# Patient Record
Sex: Female | Born: 1975 | Race: Black or African American | Hispanic: No | Marital: Single | Smoking: Never smoker
Health system: Southern US, Community
[De-identification: ages and names within clinical notes are randomized; demographics above are authoritative.]

## PROBLEM LIST (undated history)

## (undated) DIAGNOSIS — G43909 Migraine, unspecified, not intractable, without status migrainosus: Secondary | ICD-10-CM

## (undated) DIAGNOSIS — R569 Unspecified convulsions: Secondary | ICD-10-CM

## (undated) HISTORY — PX: CHOLECYSTECTOMY: SHX55

## (undated) HISTORY — DX: Unspecified convulsions: R56.9

## (undated) HISTORY — PX: TONSILLECTOMY: SUR1361

## (undated) HISTORY — DX: Migraine, unspecified, not intractable, without status migrainosus: G43.909

---

## 2018-11-18 ENCOUNTER — Encounter: Payer: Self-pay | Admitting: *Deleted

## 2018-11-21 ENCOUNTER — Encounter: Payer: Self-pay | Admitting: Neurology

## 2018-11-21 ENCOUNTER — Telehealth: Payer: Self-pay | Admitting: Neurology

## 2018-11-21 ENCOUNTER — Ambulatory Visit: Payer: BC Managed Care – PPO | Admitting: Neurology

## 2018-11-21 ENCOUNTER — Other Ambulatory Visit: Payer: Self-pay

## 2018-11-21 VITALS — BP 127/82 | HR 98 | Temp 98.0°F | Ht 67.0 in | Wt 334.0 lb

## 2018-11-21 DIAGNOSIS — G43111 Migraine with aura, intractable, with status migrainosus: Secondary | ICD-10-CM | POA: Diagnosis not present

## 2018-11-21 DIAGNOSIS — G40209 Localization-related (focal) (partial) symptomatic epilepsy and epileptic syndromes with complex partial seizures, not intractable, without status epilepticus: Secondary | ICD-10-CM

## 2018-11-21 MED ORDER — SUMATRIPTAN SUCCINATE 50 MG PO TABS: 50.0000 mg | ORAL_TABLET | ORAL | 11 refills | Status: DC | PRN

## 2018-11-21 MED ORDER — LEVETIRACETAM ER 750 MG PO TB24: 3000.0000 mg | ORAL_TABLET | Freq: Every day | ORAL | 4 refills | Status: DC

## 2018-11-21 MED ORDER — AIMOVIG 70 MG/ML ~~LOC~~ SOAJ: 70.0000 mg | SUBCUTANEOUS | 11 refills | Status: DC

## 2018-11-21 MED ORDER — LAMOTRIGINE 150 MG PO TABS: 150.0000 mg | ORAL_TABLET | Freq: Two times a day (BID) | ORAL | 4 refills | Status: DC

## 2018-11-21 NOTE — Telephone Encounter (Signed)
due to the cost she is going to wait. I did offer her the payment plan. She stated that she would like to sign a release form to get her other MRI's that she had a [facility] neurologic in [place].     BCBS Auth: [id number] (exp. 11/21/18 to 05/19/19).

## 2018-11-21 NOTE — Progress Notes (Signed)
PATIENT: Ann Travis Mogan DOB: 08/25/1975  Chief Complaint  Patient presents with  . Seizures    She was diagnosed with seizures in 2008.  She is here to establish new neurology care.  She is doing well on her current medications.  Her last seizure was approximately three years ago.    Marland Kitchen. OB-GYN    Arlana LindauFisher, Julie, NP (no PCP)     HISTORICAL  Ann Travis Thelin is a 43 year old female, seen in request by primary care nurse practitioner Arlana LindauFisher, Julie for evaluation of seizure and migraine headaches, initial evaluation was on November 21, 2018,  I have reviewed and summarized the referring note from the referring physician.  She was diagnosed with complex partial seizure since 2008, presented with transient confusion, mumbling, finger fidgety movement, few minutes, [facility], per patient, there was abnormality seen on MRI of the brain, and EEG, over the years she was treated with titrating dose of Keppra, lamotrigine, has been on current stable dose of Keppra XR 750 mg 4 tablets every morning, lamotrigine 150 mg twice a day, which works well for her seizure, last week current seizure was few years ago,  She also reported long history of migraine headaches, usually at the right retro-orbital area, preceded by visual aura, followed by pounding headache with associated light noise sensitivity, lasting few hours to few days, over-the-counter Excedrin Migraine does not provide effective help   REVIEW OF SYSTEMS: Full 14 system review of systems performed and notable only for as above All other review of systems were negative.  ALLERGIES: Allergies  Allergen Reactions  . Morphine And Related Anaphylaxis and Hives    HOME MEDICATIONS: Current Outpatient Medications  Medication Sig Dispense Refill  . CALCIUM PO Take 600 mg by mouth daily.    Marland Kitchen. lamoTRIgine (LAMICTAL) 150 MG tablet Take 150 mg by mouth 2 (two) times daily.     . Levetiracetam (KEPPRA XR) 750  MG TB24 Take 3,000 mg by mouth daily.     . medroxyPROGESTERone (DEPO-PROVERA) 150 MG/ML injection Inject 150 mg into the muscle every 3 (three) months.    . Multiple Vitamin (MULTIVITAMIN) tablet Take 1 tablet by mouth daily.    . nortriptyline (PAMELOR) 25 MG capsule Take 25 mg by mouth at bedtime.     No current facility-administered medications for this visit.     PAST MEDICAL HISTORY: Past Medical History:  Diagnosis Date  . Migraine   . Seizure (HCC)     PAST SURGICAL HISTORY: Past Surgical History:  Procedure Laterality Date  . CHOLECYSTECTOMY    . TONSILLECTOMY      FAMILY HISTORY: Family History  Problem Relation Age of Onset  . Hypertension Mother   . Stroke Mother   . Breast cancer Mother        passed away at age 161  . Hypercholesterolemia Mother   . Diabetes Father   . Hypertension Father   . Hypercholesterolemia Father   . Breast cancer Maternal Aunt        passed away at age 45  . Diabetes Maternal Aunt     SOCIAL HISTORY: Social History   Socioeconomic History  . Marital status: Single    Spouse name: Not on file  . Number of children: 1  . Years of education: college  . Highest education level: Not on file  Occupational History  . 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 Financial resource strain: Not on file  . Food  insecurity    Worry: Not on file    Inability: Not on file  . Transportation needs    Medical: Not on file    Non-medical: Not on file  Tobacco Use  . Smoking status: Never Smoker  . Smokeless tobacco: Never Used  Substance and Sexual Activity  . Alcohol use: Not Currently  . Drug use: Never  . Sexual activity: Not on file  Lifestyle  . Physical activity    Days per week: Not on file    Minutes per session: Not on file  . Stress: Not on file  Relationships  . Social Herbalist on phone: Not on file    Gets together: Not on file    Attends religious service: Not on file    Active member of club or  organization: Not on file    Attends meetings of clubs or organizations: Not on file    Relationship status: Not on file  . Intimate partner violence    Fear of current or ex partner: Not on file    Emotionally abused: Not on file    Physically abused: Not on file    Forced sexual activity: Not on file  Other Topics Concern  . Not on file  Social History Narrative   Lives alone.   No caffeine use.   Right-handed.     PHYSICAL EXAM   Vitals:   11/21/18 0720  BP: 127/82  Pulse: 98  Temp: 98 F (36.7 C)  Weight: (!) 334 lb (151.5 kg)  Height: 5\' 7"  (1.702 m)    Not recorded      Body mass index is 52.31 kg/m.  PHYSICAL EXAMNIATION:  Gen: NAD, conversant, well nourised, obese, well groomed                     Cardiovascular: Regular rate rhythm, no peripheral edema, warm, nontender. Eyes: Conjunctivae clear without exudates or hemorrhage Neck: Supple, no carotid bruits. Pulmonary: Clear to auscultation bilaterally   NEUROLOGICAL EXAM:  MENTAL STATUS: Speech:    Speech is normal; fluent and spontaneous with normal comprehension.  Cognition:     Orientation to time, place and person     Normal recent and remote memory     Normal Attention span and concentration     Normal Language, naming, repeating,spontaneous speech     Fund of knowledge   CRANIAL NERVES: CN II: Visual fields are full to confrontation. Fundoscopic exam is normal with sharp discs and no vascular changes. Pupils are round equal and briskly reactive to light. CN III, IV, VI: extraocular movement are normal. No ptosis. CN V: Facial sensation is intact to pinprick in all 3 divisions bilaterally. Corneal responses are intact.  CN VII: Face is symmetric with normal eye closure and smile. CN VIII: Hearing is normal to rubbing fingers CN IX, X: Palate elevates symmetrically. Phonation is normal. CN XI: Head turning and shoulder shrug are intact CN XII: Tongue is midline with normal movements and no  atrophy.  MOTOR: There is no pronator drift of out-stretched arms. Muscle bulk and tone are normal. Muscle strength is normal.  REFLEXES: Reflexes are 2+ and symmetric at the biceps, triceps, knees, and ankles. Plantar responses are flexor.  SENSORY: Intact to light touch, pinprick, positional sensation and vibratory sensation are intact in fingers and toes.  COORDINATION: Rapid alternating movements and fine finger movements are intact. There is no dysmetria on finger-to-nose and heel-knee-shin.    GAIT/STANCE: Obese,  need push-up to get up from seated position, mildly unsteady Romberg is absent.   DIAGNOSTIC DATA (LABS, IMAGING, TESTING) - I reviewed patient records, labs, notes, testing and imaging myself where available.   ASSESSMENT AND PLAN  Ann Travis Flinchbaugh is a 43 y.o. female   Complex partial seizure Chronic migraine headache with aura  Proceed with laboratory evaluations  MRI of the brain with without contrast  EEG  Refill her prescription of Keppra xr 750mg  4 tabs daily, lamotrigine 150mg  bid  Aimovig 70mg  every month as migraine preventive medications  Imitrex 50mg  prn  Levert FeinsteinYijun Tully Burgo, M.D. Ph.D.  [facility] [address], [address] [place], [place] [address] Ph: [phone] Fax: [phone]  CC: Arlana LindauFisher, Julie, NP

## 2018-11-22 ENCOUNTER — Telehealth: Payer: Self-pay | Admitting: *Deleted

## 2018-11-22 LAB — COMPREHENSIVE METABOLIC PANEL
ALT: 16 IU/L (ref 0–32)
AST: 19 IU/L (ref 0–40)
Albumin/Globulin Ratio: 1.3 (ref 1.2–2.2)
Albumin: 4 g/dL (ref 3.8–4.8)
Alkaline Phosphatase: 96 IU/L (ref 39–117)
BUN/Creatinine Ratio: 14 (ref 9–23)
BUN: 14 mg/dL (ref 6–24)
Bilirubin Total: 0.2 mg/dL (ref 0.0–1.2)
CO2: 24 mmol/L (ref 20–29)
Calcium: 9.2 mg/dL (ref 8.7–10.2)
Chloride: 101 mmol/L (ref 96–106)
Creatinine, Ser: 1.01 mg/dL — ABNORMAL HIGH (ref 0.57–1.00)
GFR calc Af Amer: 78 mL/min/{1.73_m2} (ref >59)
GFR calc non Af Amer: 68 mL/min/{1.73_m2} (ref >59)
Globulin, Total: 3 g/dL (ref 1.5–4.5)
Glucose: 70 mg/dL (ref 65–99)
Potassium: 4 mmol/L (ref 3.5–5.2)
Sodium: 138 mmol/L (ref 134–144)
Total Protein: 7 g/dL (ref 6.0–8.5)

## 2018-11-22 LAB — CBC WITH DIFFERENTIAL
Basophils Absolute: 0 10*3/uL (ref 0.0–0.2)
Basos: 0 %
EOS (ABSOLUTE): 0 10*3/uL (ref 0.0–0.4)
Eos: 0 %
Hematocrit: 40.2 % (ref 34.0–46.6)
Hemoglobin: 13.2 g/dL (ref 11.1–15.9)
Immature Grans (Abs): 0 10*3/uL (ref 0.0–0.1)
Immature Granulocytes: 0 %
Lymphocytes Absolute: 1.5 10*3/uL (ref 0.7–3.1)
Lymphs: 24 %
MCH: 27.2 pg (ref 26.6–33.0)
MCHC: 32.8 g/dL (ref 31.5–35.7)
MCV: 83 fL (ref 79–97)
Monocytes Absolute: 0.4 10*3/uL (ref 0.1–0.9)
Monocytes: 6 %
Neutrophils Absolute: 4.4 10*3/uL (ref 1.4–7.0)
Neutrophils: 70 %
RBC: 4.86 x10E6/uL (ref 3.77–5.28)
RDW: 13 % (ref 11.7–15.4)
WBC: 6.2 10*3/uL (ref 3.4–10.8)

## 2018-11-22 LAB — TSH: TSH: 1.85 u[IU]/mL (ref 0.450–4.500)

## 2018-11-22 NOTE — Telephone Encounter (Signed)
PA for Aimovig completed on covermymeds (key: [id number]).  Pt has coverage through [facility] [phone]).  PA approved through 02/19/2019.

## 2018-11-22 NOTE — Telephone Encounter (Signed)
I spoke to the patient and she has been informed of her lab results. 

## 2018-11-22 NOTE — Telephone Encounter (Signed)
-----   Message from Marcial Pacas, MD sent at 11/22/2018  8:19 AM EDT ----- Please call patient for no significant abnormality on laboratory evaluations

## 2018-11-30 ENCOUNTER — Telehealth: Payer: Self-pay | Admitting: Neurology

## 2018-11-30 NOTE — Telephone Encounter (Signed)
I called this patient to schedule an EEG per Dr. Krista Blue. Patient is concerned about the cost and what insurance will cover. I advised patient that regardless of insurance she is able to set up a monthly payment plan with our billing department. I also advised patient that she should call her insurance to get the specific information. Patient states she will call back if she would like to schedule this after speaking with her insurance company.

## 2018-12-20 ENCOUNTER — Telehealth: Payer: Self-pay | Admitting: Neurology

## 2018-12-20 NOTE — Telephone Encounter (Signed)
A 90 day prescription x 4 refills was [facility] to [facility] on [address] on 11/21/2018.  I attempted to return the call to the patient to notify her that this request has already been completed.  No answer and voicemail full.  She just needs to call the pharmacy when she is ready to pick up her next prescription.

## 2018-12-20 NOTE — Telephone Encounter (Signed)
Pt called needing a refill on her Levetiracetam (KEPPRA XR) 750 MG TB24 [facility] on [address] Pt would like to get a 3 month supply due to it being cheater that way for her. Please advise.

## 2019-01-12 ENCOUNTER — Telehealth: Payer: Self-pay | Admitting: Neurology

## 2019-01-12 MED ORDER — LINACLOTIDE 72 MCG PO CAPS: 72.0000 ug | ORAL_CAPSULE | Freq: Every day | ORAL | 11 refills | Status: DC

## 2019-01-12 NOTE — Telephone Encounter (Signed)
Pt states she is having constipation as a result of Aimovig.  Pt is asking something be called in for her.  Please call

## 2019-01-12 NOTE — Addendum Note (Signed)
Addended by: Marcial Pacas on: 01/12/2019 01:12 PM   Modules accepted: Orders

## 2019-01-12 NOTE — Telephone Encounter (Signed)
I prescribed Linzess 72 mg daily  Ajovy might has less constipation side effect, we may switch her to Ajovy if she wants

## 2019-01-12 NOTE — Telephone Encounter (Signed)
I spoke to the patient.  States she has tried multiple OTC stool softeners.  She even tried the OTC combination of medications used for a colonoscopy.  It helped but only temporarily.  She would like to stay on Aimovig, if possible, because it has been very helpful for her migraines.  She is asking if Linzess can be prescribed.

## 2019-01-12 NOTE — Telephone Encounter (Signed)
I returned the call to the patient to let her know the prescription has been sent to the pharmacy.

## 2019-02-08 ENCOUNTER — Telehealth: Payer: Self-pay | Admitting: *Deleted

## 2019-02-08 NOTE — Telephone Encounter (Signed)
PA for Aimovig completed on covermymeds (key: [id number]).  Pt has coverage through [facility] [phone]).  Decision pending.

## 2019-02-08 NOTE — Telephone Encounter (Signed)
PA approved through 02/07/2020.

## 2019-02-20 ENCOUNTER — Ambulatory Visit: Payer: Self-pay | Admitting: Neurology

## 2019-02-21 ENCOUNTER — Ambulatory Visit: Payer: Self-pay | Admitting: Neurology

## 2019-11-08 ENCOUNTER — Ambulatory Visit: Payer: Self-pay | Admitting: Skilled Nursing Facility1

## 2019-11-09 ENCOUNTER — Other Ambulatory Visit: Payer: Self-pay

## 2019-11-09 ENCOUNTER — Encounter: Payer: 59 | Attending: Surgery | Admitting: Skilled Nursing Facility1

## 2019-11-09 ENCOUNTER — Encounter: Payer: Self-pay | Admitting: Skilled Nursing Facility1

## 2019-11-09 DIAGNOSIS — E669 Obesity, unspecified: Secondary | ICD-10-CM | POA: Insufficient documentation

## 2019-11-09 NOTE — Progress Notes (Signed)
Nutrition Assessment for Bariatric Surgery Medical Nutrition Therapy Appt Start Time: 8:03 End Time: 9:05  Patient was seen on 11/09/2019 for Pre-Operative Nutrition Assessment. Letter of approval faxed to [facility] bariatric surgery program coordinator on 11/09/2019  Referral stated Supervised Weight Loss (SWL) visits needed: 0  Planned surgery: sleeve gastrectomy  Pt expectation of surgery: to lose weight Pt expectation of dietitian: guidance    NUTRITION ASSESSMENT   Anthropometrics  Start weight at [facility]: 344 lbs (date: 11/09/2019)  Height: 67 in BMI: 53.88 kg/m2     Clinical  Medical hx: seizures,  Medications: see list Labs:  Notable signs/symptoms: migraines, allergic to fresh fruits and vegetables, arthritic knees needing surgery, diverticulosis resulting in colectomy  Any previous deficiencies? No  Micronutrient Nutrition Focused Physical Exam: Hair: No issues observed Eyes: No issues observed Mouth: No issues observed Neck: No issues observed Nails: No issues observed Skin: No issues observed  Lifestyle & Dietary Hx  Allergies: banana, tree nuts, grapes, apples, watermelon, all fresh fruits but canned fruit is okay, tomato sauce but not fresh tomato Pt states he does not eat greens because it looks like grass.  Pt states she has been doing water aerobics.   24-Hr Dietary Recall First Meal: sausage dog  Snack: protein bar Second Meal: cheese burger Snack: cereal Third Meal: cereal Snack:  Beverages: water   Estimated Energy Needs Calories: 1600   NUTRITION DIAGNOSIS  Overweight/obesity ([place]-3.3) related to past poor dietary habits and physical inactivity as evidenced by patient w/ planned sleeve gastrectomy surgery following dietary guidelines for continued weight loss.    NUTRITION INTERVENTION  Nutrition counseling (C-1) and education (E-2) to facilitate bariatric surgery goals.   Pre-Op Goals Reviewed with the Patient . Track food  and beverage intake (pen and paper, MyFitness Pal, Baritastic app, etc.) . Make healthy food choices while monitoring portion sizes . Consume 3 meals per day or try to eat every 3-5 hours . Avoid concentrated sugars and fried foods . Keep sugar & fat in the single digits per serving on food labels . Practice CHEWING your food (aim for applesauce consistency) . Practice not drinking 15 minutes before, during, and 30 minutes after each meal and snack . Avoid all carbonated beverages (ex: soda, sparkling beverages)  . Limit caffeinated beverages (ex: coffee, tea, energy drinks) . Avoid all sugar-sweetened beverages (ex: regular soda, sports drinks)  . Avoid alcohol  . Aim for 64-100 ounces of FLUID daily (with at least half of fluid intake being plain water)  . Aim for at least 60-80 grams of PROTEIN daily . Look for a liquid protein source that contains ?15 g protein and ?5 g carbohydrate (ex: shakes, drinks, shots) . Make a list of non-food related activities . Physical activity is an important part of a healthy lifestyle so keep it moving! The goal is to reach 150 minutes of exercise per week, including cardiovascular and weight baring activity.  *Goals that are bolded indicate the pt would like to start working towards these  Handouts Provided Include  . Bariatric Surgery handouts (Nutrition Visits, Pre-Op Goals, Protein Shakes, Vitamins & Minerals)  Learning Style & Readiness for Change Teaching method utilized: Visual & Auditory  Demonstrated degree of understanding via: Teach Back  Barriers to learning/adherence to lifestyle change: picky eating     MONITORING & EVALUATION Dietary intake, weekly physical activity, body weight, and pre-op goals reached at next nutrition visit.    Next Steps  Patient is to follow up at [facility]  for Pre-Op Class >2 weeks before surgery for further nutrition education.

## 2019-12-11 ENCOUNTER — Other Ambulatory Visit: Payer: Self-pay | Admitting: Surgery

## 2019-12-11 ENCOUNTER — Other Ambulatory Visit (HOSPITAL_COMMUNITY): Payer: Self-pay | Admitting: Surgery

## 2019-12-25 ENCOUNTER — Other Ambulatory Visit: Payer: Self-pay

## 2019-12-25 ENCOUNTER — Ambulatory Visit (HOSPITAL_COMMUNITY)
Admission: RE | Admit: 2019-12-25 | Discharge: 2019-12-25 | Disposition: A | Discharge status: Home / Self Care | Payer: 59 | Source: Ambulatory Visit | Attending: Surgery | Admitting: Surgery

## 2020-01-20 ENCOUNTER — Other Ambulatory Visit: Payer: Self-pay | Admitting: Neurology

## 2020-01-21 ENCOUNTER — Other Ambulatory Visit: Payer: Self-pay | Admitting: Neurology

## 2020-02-01 ENCOUNTER — Encounter: Payer: Self-pay | Admitting: Gastroenterology

## 2020-02-15 ENCOUNTER — Other Ambulatory Visit: Payer: Self-pay | Admitting: Neurology

## 2020-02-20 ENCOUNTER — Encounter: Payer: Self-pay | Admitting: Neurology

## 2020-02-20 ENCOUNTER — Ambulatory Visit (INDEPENDENT_AMBULATORY_CARE_PROVIDER_SITE_OTHER): Payer: 59 | Admitting: Neurology

## 2020-02-20 ENCOUNTER — Telehealth: Payer: Self-pay | Admitting: Neurology

## 2020-02-20 ENCOUNTER — Other Ambulatory Visit: Payer: Self-pay

## 2020-02-20 VITALS — BP 143/91 | HR 69 | Ht 67.0 in | Wt 342.0 lb

## 2020-02-20 DIAGNOSIS — G40209 Localization-related (focal) (partial) symptomatic epilepsy and epileptic syndromes with complex partial seizures, not intractable, without status epilepticus: Secondary | ICD-10-CM

## 2020-02-20 DIAGNOSIS — G43111 Migraine with aura, intractable, with status migrainosus: Secondary | ICD-10-CM

## 2020-02-20 MED ORDER — LAMOTRIGINE 100 MG PO TABS: 100.0000 mg | ORAL_TABLET | Freq: Two times a day (BID) | ORAL | 4 refills | Status: DC

## 2020-02-20 MED ORDER — LEVETIRACETAM ER 750 MG PO TB24
3000.0000 mg | ORAL_TABLET | Freq: Every day | ORAL | 4 refills | Status: DC
Start: 2020-02-20 — End: 2021-05-07

## 2020-02-20 MED ORDER — DOCUSATE SODIUM 100 MG PO CAPS: 100.0000 mg | ORAL_CAPSULE | Freq: Two times a day (BID) | ORAL | 11 refills | Status: AC

## 2020-02-20 MED ORDER — SUMATRIPTAN SUCCINATE 50 MG PO TABS: 50.0000 mg | ORAL_TABLET | ORAL | 11 refills | Status: DC | PRN

## 2020-02-20 NOTE — Progress Notes (Addendum)
PATIENT: Ann Travis DOB: 03/20/1976  Chief Complaint  Patient presents with  . Migraine    She stopped using Aimovig 70mg  after three months. She does not like giving herself injections and felt it did not reduce her headaches. She would like to discuss an oral preventive instead. She never tried the sumatriptan but would like it refilled today.   . Seizures    She is taking Keppra XR 750mg , 4 tabs daily. She is only taking Lamictal 150mg  in the morning because she forget her evening dose. Denies any seizure activity.      HISTORICAL  Ann Travis is a 44 year old female, seen in request by primary care nurse practitioner for evaluation of seizure and migraine headaches, initial evaluation was on November 21, 2018,  I have reviewed and summarized the referring note from the referring physician.  She was diagnosed with complex partial seizure since 2008, presented with transient confusion, mumbling, finger fidgety movement, few minutes, she was under the care of [facility] neurologist, per patient, there was abnormality seen on MRI of the brain, and EEG, over the years she was treated with titrating dose of Keppra, lamotrigine, has been on current stable dose of Keppra XR 750 mg 4 tablets every morning, lamotrigine 150 mg twice a day, which works well for her seizure, last week current seizure was few years ago,  She also reported long history of migraine headaches, usually at the right retro-orbital area, preceded by visual aura, followed by pounding headache with associated light noise sensitivity, lasting few hours to few days, over-the-counter Excedrin Migraine does not provide effective help  Update February 20, 2020: Laboratory evaluation showed normal CBC CMP TSH, She did not have previously ordered MRI of the brain and EEG due to concern of the cost, she is taking Keppra XR 750 mg 4 tablets every night, lamotrigine only 150 mg every morning, there was no recurrent  seizure, She has headache once every 2 weeks, she never picked up her Imitrex,  Previously treating neurologist was at 2009, last office visit with them was early 2020 before she moved  REVIEW OF SYSTEMS: Full 14 system review of systems performed and notable only for as above All other review of systems were negative.  ALLERGIES: Allergies  Allergen Reactions  . Morphine And Related Anaphylaxis and Hives    HOME MEDICATIONS: Current Outpatient Medications  Medication Sig Dispense Refill  . CALCIUM PO Take 600 mg by mouth daily.    February 22, 2020 lamoTRIgine (LAMICTAL) 150 MG tablet TAKE 1 TABLET BY MOUTH TWICE A DAY 180 tablet 0  . Levetiracetam 750 MG TB24 TAKE 4 TABLETS (3,000 MG TOTAL) BY MOUTH DAILY. 360 tablet 0  . medroxyPROGESTERone (DEPO-PROVERA) 150 MG/ML injection Inject 150 mg into the muscle every 3 (three) months.    . nortriptyline (PAMELOR) 25 MG capsule Take 25 mg by mouth as needed for sleep.     . SUMAtriptan (IMITREX) 50 MG tablet Take 1 tablet (50 mg total) by mouth every 2 (two) hours as needed for migraine. May repeat in 2 hours if headache persists or recurs. 12 tablet 11   No current facility-administered medications for this visit.    PAST MEDICAL HISTORY: Past Medical History:  Diagnosis Date  . Migraine   . Seizure (HCC)     PAST SURGICAL HISTORY: Past Surgical History:  Procedure Laterality Date  . CHOLECYSTECTOMY    . TONSILLECTOMY      FAMILY HISTORY: Family History  Problem Relation Age of  Onset  . Hypertension Mother   . Stroke Mother   . Breast cancer Mother        passed away at age 68  . Hypercholesterolemia Mother   . Diabetes Father   . Hypertension Father   . Hypercholesterolemia Father   . Breast cancer Maternal Aunt        passed away at age 39  . Diabetes Maternal Aunt     SOCIAL HISTORY: Social History   Socioeconomic History  . Marital status: Single    Spouse name: Not on file  . Number of children: 1  . Years of  education: college  . Highest education level: Not on file  Occupational History  . Occupation: Child psychotherapist  Tobacco Use  . Smoking status: Never Smoker  . Smokeless tobacco: Never Used  Substance and Sexual Activity  . Alcohol use: Not Currently  . Drug use: Never  . Sexual activity: Not on file  Other Topics Concern  . Not on file  Social History Narrative   Lives alone.   No caffeine use.   Right-handed.   Social Determinants of Health   Financial Resource Strain:   . Difficulty of Paying Living Expenses: Not on file  Food Insecurity:   . Worried About Programme researcher, broadcasting/film/video in the Last Year: Not on file  . Ran Out of Food in the Last Year: Not on file  Transportation Needs:   . Lack of Transportation (Medical): Not on file  . Lack of Transportation (Non-Medical): Not on file  Physical Activity:   . Days of Exercise per Week: Not on file  . Minutes of Exercise per Session: Not on file  Stress:   . Feeling of Stress : Not on file  Social Connections:   . Frequency of Communication with Friends and Family: Not on file  . Frequency of Social Gatherings with Friends and Family: Not on file  . Attends Religious Services: Not on file  . Active Member of Clubs or Organizations: Not on file  . Attends Banker Meetings: Not on file  . Marital Status: Not on file  Intimate Partner Violence:   . Fear of Current or Ex-Partner: Not on file  . Emotionally Abused: Not on file  . Physically Abused: Not on file  . Sexually Abused: Not on file     PHYSICAL EXAM   Vitals:   02/20/20 0921  BP: (!) 143/91  Pulse: 69  Weight: (!) 342 lb (155.1 kg)  Height: 5\' 7"  (1.702 m)   Not recorded     Body mass index is 53.56 kg/m.  PHYSICAL EXAMNIATION:  Gen: NAD, conversant, well nourised, obese, well groomed, obese              NEUROLOGICAL EXAM:  MENTAL STATUS: Speech/cognition: Awake alert oriented to history taking and casual conversation   CRANIAL  NERVES: CN II: Visual fields are full to confrontation. Pupils are round equal and briskly reactive to light. CN III, IV, VI: extraocular movement are normal. No ptosis. CN V: Facial sensation is intact to pinprick in all 3 divisions bilaterally.  CN VII: Face is symmetric with normal eye closure and smile. CN VIII: Hearing is normal to rubbing fingers CN IX, X: Palate elevates symmetrically. Phonation is normal. CN XI: Head turning and shoulder shrug are intact   MOTOR: There is no pronator drift of out-stretched arms. Muscle bulk and tone are normal. Muscle strength is normal.  REFLEXES: Reflexes are 2+ and symmetric  at the biceps, triceps, knees, and ankles. Plantar responses are flexor.  SENSORY: Intact to light touch, pinprick, positional sensation and vibratory sensation are intact in fingers and toes.  COORDINATION: Rapid alternating movements and fine finger movements are intact. There is no dysmetria on finger-to-nose and heel-knee-shin.    GAIT/STANCE: Obese, need push-up to get up from seated position, mildly unsteady due to big body habitus Romberg is absent.   DIAGNOSTIC DATA (LABS, IMAGING, TESTING) - I reviewed patient records, labs, notes, testing and imaging myself where available.   ASSESSMENT AND PLAN  Ann Travis is a 44 y.o. female   Reported history of complex partial seizure Chronic migraine headache with aura  Medical record from previous treating physician at [facility],  Refill her prescription of Keppra xr 750mg  4 tabs daily, she had no recurrent seizure taking lower dose of lamotrigine 150 mg daily, will decrease to lamotrigine 100 mg twice a day  Imitrex 50mg  prn  Chronic constipation  Colace as needed  , M.D. Ph.D.  [facility] [address] [place], [address] [place] Ph: [phone] Fax: [phone]  CC: [phone], NP  Addendum: I read [facility] neurology evaluation by Dr.Noroozi, [phone] From March 25, 2018, reported history of seizure, on generic Keppra XR 750 mg 4 tablets daily, generic lamotrigine 150 mg twice a day, normal MRI of the brain and EEG in 2006.  History of migraine headaches  Laboratory evaluations, lamotrigine level on September 03, 2014 was 13.9, normal CBC, hemoglobin of 12.1, CMP, creatinine of 0.91,

## 2020-02-20 NOTE — Telephone Encounter (Signed)
Get medical record from [facility]  Medical Records Staff To request your medical records, download and complete the Authorization for Use or Disclosure of Protected Health Information.  For questions regarding release of information and scanning, please call [phone]  Medical Records Fax Number - [phone]  Mailing Address - Attn: Release of Information, [address] [place], [place] [address]  To send ROI requests by email, please send to [email or web address] for processing

## 2020-03-01 ENCOUNTER — Telehealth: Payer: Self-pay | Admitting: *Deleted

## 2020-03-01 NOTE — Telephone Encounter (Signed)
Dr Meridee Score-  Would you like this patient to have an office visit or direct to [facility]. Last recorded BMI 53.5 and hx seizures.  If you want OV will you please forward this to your medical assistant to set up appointment and schedule at [facility]. Thanks!

## 2020-03-01 NOTE — Telephone Encounter (Signed)
Okay for direct colonoscopy unless patient wants to be seen in clinic first. Thanks. GM

## 2020-03-04 NOTE — Telephone Encounter (Signed)
Attempted to reach patient - unable - LMOVM for patient to call back to the office -will try again to reach patient at a later date/time;

## 2020-03-04 NOTE — Telephone Encounter (Signed)
Records placed on Dr. Zannie Cove desk for review.

## 2020-03-06 NOTE — Telephone Encounter (Signed)
Called and spoke with patient- patient reports she is in the process of moving and is requesting to be scheduled for an OV sometime in the new year due to this process; patient reports she was told she may have a bleeding ulcer due to her NSAID consumption and she is refraining from using these mediations; patient has been scheduled for an  OV and patient advised to call back to the office should further issues arise; patient advised of office number of [phone];

## 2020-03-26 ENCOUNTER — Encounter: Payer: 59 | Admitting: Gastroenterology

## 2020-04-16 ENCOUNTER — Other Ambulatory Visit: Payer: Self-pay | Admitting: Neurology

## 2020-04-17 ENCOUNTER — Ambulatory Visit: Payer: 59 | Admitting: Gastroenterology

## 2021-02-19 ENCOUNTER — Ambulatory Visit: Payer: 59 | Admitting: Neurology

## 2021-03-14 ENCOUNTER — Other Ambulatory Visit: Payer: Self-pay | Admitting: Neurology

## 2021-05-02 ENCOUNTER — Other Ambulatory Visit: Payer: Self-pay | Admitting: Neurology

## 2021-05-06 NOTE — Progress Notes (Signed)
PATIENT: Ann Travis DOB: [birth year]  REASON FOR VISIT: Follow up for seizures, headaches HISTORY FROM: Patient PRIMARY NEUROLOGIST: Dr. Terrace Arabia   HISTORY  Ann Travis is a 46 year old female, seen in request by primary care nurse practitioner Arlana Lindau for evaluation of seizure and migraine headaches, initial evaluation was on November 21, 2018,   I have reviewed and summarized the referring note from the referring physician.  She was diagnosed with complex partial seizure since 2008, presented with transient confusion, mumbling, finger fidgety movement, few minutes, she was under the care of [facility] neurologist, per patient, there was abnormality seen on MRI of the brain, and EEG, over the years she was treated with titrating dose of Keppra, lamotrigine, has been on current stable dose of Keppra XR 750 mg 4 tablets every morning, lamotrigine 150 mg twice a day, which works well for her seizure, last week current seizure was few years ago,   She also reported long history of migraine headaches, usually at the right retro-orbital area, preceded by visual aura, followed by pounding headache with associated light noise sensitivity, lasting few hours to few days, over-the-counter Excedrin Migraine does not provide effective help   Update February 20, 2020: Laboratory evaluation showed normal CBC CMP TSH, She did not have previously ordered MRI of the brain and EEG due to concern of the cost, she is taking Keppra XR 750 mg 4 tablets every night, lamotrigine only 150 mg every morning, there was no recurrent seizure, She has headache once every 2 weeks, she never picked up her Imitrex,   Previously treating neurologist was at [facility], last office visit with them was early 2020 before she moved  Addendum: I read [facility] neurology evaluation by Dr.Noroozi, Jilda Panda From March 25, 2018, reported history of seizure, on generic Keppra XR 750 mg 4 tablets daily, generic lamotrigine 150 mg twice a day,  normal MRI of the brain and EEG in 2006.  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 On Keppra 750 mg XR, 4 tablets in the morning. No seizures, are characterized as lapse in time or mumbling, none in years. Migraines are twice a month that are severe, Imitrex 50 mg is out of the medication, had to combine with Excedrin migraine, benefit wasn't great. Right sided headache. Had sleep study, no sleep apnea.  REVIEW OF SYSTEMS: Out of a complete 14 system review of symptoms, the patient complains only of the following symptoms, and all other reviewed systems are negative.  See HPI  ALLERGIES: Allergies  Allergen Reactions   Morphine And Related Anaphylaxis and Hives    HOME MEDICATIONS: Outpatient Medications Prior to Visit  Medication Sig Dispense Refill   aspirin-acetaminophen-caffeine (EXCEDRIN MIGRAINE) 250-250-65 MG tablet Take by mouth every 6 (six) hours as needed for headache.     CALCIUM PO Take 600 mg by mouth daily.     docusate sodium (COLACE) 100 MG capsule Take 1 capsule (100 mg total) by mouth 2 (two) times daily. 60 capsule 11   medroxyPROGESTERone (DEPO-PROVERA) 150 MG/ML injection Inject 150 mg into the muscle every 3 (three) months.     lamoTRIgine (LAMICTAL) 100 MG tablet TAKE 1 TABLET BY MOUTH TWICE A DAY 180 tablet  0   Levetiracetam 750 MG TB24 Take 4 tablets (3,000 mg total) by mouth daily. 360 tablet 4   SUMAtriptan (IMITREX) 50 MG tablet Take 1 tablet (50 mg total) by mouth every 2 (two) hours as needed for migraine. May repeat in 2 hours if headache persists or recurs. 12 tablet 11   No facility-administered medications prior to visit.    PAST MEDICAL HISTORY: Past Medical  History:  Diagnosis Date   Migraine    Seizure (HCC)     PAST SURGICAL HISTORY: Past Surgical History:  Procedure Laterality Date   CHOLECYSTECTOMY     TONSILLECTOMY      FAMILY HISTORY: Family History  Problem Relation Age of Onset   Hypertension Mother    Stroke Mother    Breast cancer Mother        passed away at age 27   Hypercholesterolemia Mother    Diabetes Father    Hypertension Father    Hypercholesterolemia Father    Breast cancer Maternal Aunt        passed away at age 69   Diabetes Maternal Aunt     SOCIAL HISTORY: Social History   Socioeconomic History   Marital status: Single    Spouse name: Not on file   Number of children: 1   Years of education: college   Highest education level: Not on file  Occupational History   Occupation: Child psychotherapist  Tobacco Use   Smoking status: Never   Smokeless tobacco: Never  Substance and Sexual Activity   Alcohol use: Not Currently   Drug use: Never   Sexual activity: Not on file  Other Topics Concern   Not on file  Social History Narrative   Lives alone.   No caffeine use.   Right-handed.   Social Determinants of Health   Financial Resource Strain: Not on file  Food Insecurity: Not on file  Transportation Needs: Not on file  Physical Activity: Not on file  Stress: Not on file  Social Connections: Not on file  Intimate Partner Violence: Not on file   PHYSICAL EXAM  There were no vitals filed for this visit. There is no height or weight on file to calculate BMI.  Generalized: Well developed, in no acute distress   Neurological examination  Mentation: Alert oriented to time, place, history taking. Follows all commands speech and language fluent Cranial nerve II-XII: Pupils were equal round reactive to light. Extraocular movements were full, visual field were full on confrontational test. Facial sensation and strength were normal. Head turning and shoulder shrug  were normal and symmetric. Motor:  The motor testing reveals 5 over 5 strength of all 4 extremities. Good symmetric motor tone is noted throughout.  Sensory: Sensory testing is intact to soft touch on all 4 extremities. No evidence of extinction is noted.  Coordination: Cerebellar testing reveals good finger-nose-finger and heel-to-shin bilaterally.  Gait and station: Gait is normal.  Reflexes: Deep tendon reflexes are symmetric and normal bilaterally.   DIAGNOSTIC DATA (LABS, IMAGING, TESTING) - I reviewed patient records, labs, notes, testing and imaging myself where available.  Lab Results  Component Value Date   WBC 6.2 11/21/2018   HGB 13.2 11/21/2018   HCT 40.2 11/21/2018   MCV 83 11/21/2018      Component Value Date/Time   NA 138 11/21/2018 0809   K 4.0 11/21/2018 0809   CL 101 11/21/2018 0809   CO2 24 11/21/2018 0809   GLUCOSE 70 11/21/2018 0809   BUN 14  11/21/2018 0809   CREATININE 1.01 (H) 11/21/2018 0809   CALCIUM 9.2 11/21/2018 0809   PROT 7.0 11/21/2018 0809   ALBUMIN 4.0 11/21/2018 0809   AST 19 11/21/2018 0809   ALT 16 11/21/2018 0809   ALKPHOS 96 11/21/2018 0809   BILITOT 0.2 11/21/2018 0809   GFRNONAA 68 11/21/2018 0809   GFRAA 78 11/21/2018 0809   No results found for: CHOL, HDL, LDLCALC, LDLDIRECT, TRIG, CHOLHDL No results found for: ZCHY8F No results found for: VITAMINB12 Lab Results  Component Value Date   TSH 1.850 11/21/2018   ASSESSMENT AND PLAN 46 y.o. year old female   1.  History of complex partial seizure  2.  Chronic migraine headache without aura  -No recent seizure in several years -Supposed to be taking Lamictal 100 mg twice daily, but is only taking 100 mg AM, will switch to Lamictal XR 100 mg daily (in the past seizures were not well controlled on single agent Keppra until Lamictal was added, has been able to reduce the dose from 150 mg twice daily) -Continue Keppra XR 750 mg, 4 tablets daily -Try Maxalt 10 mg as needed for acute headache, Imitrex was not  helpful -Check routine labs today, seizure drug levels  -Call for seizure spells, otherwise follow-up 1 year or sooner if needed  Margie Ege, Edrick Oh, DNP 05/07/2021, 2:26 PM [facility] [address], [address] [place], [place] [address] [phone]

## 2021-05-07 ENCOUNTER — Encounter: Payer: Self-pay | Admitting: Neurology

## 2021-05-07 ENCOUNTER — Ambulatory Visit (INDEPENDENT_AMBULATORY_CARE_PROVIDER_SITE_OTHER): Payer: Managed Care, Other (non HMO) | Admitting: Neurology

## 2021-05-07 DIAGNOSIS — G40209 Localization-related (focal) (partial) symptomatic epilepsy and epileptic syndromes with complex partial seizures, not intractable, without status epilepticus: Secondary | ICD-10-CM | POA: Diagnosis not present

## 2021-05-07 MED ORDER — LEVETIRACETAM ER 750 MG PO TB24
3000.0000 mg | ORAL_TABLET | Freq: Every day | ORAL | 4 refills | Status: DC
Start: 2021-05-07 — End: 2021-12-02

## 2021-05-07 MED ORDER — LAMOTRIGINE ER 100 MG PO TB24: 100.0000 mg | ORAL_TABLET | Freq: Every day | ORAL | 11 refills | Status: DC

## 2021-05-07 MED ORDER — RIZATRIPTAN BENZOATE 10 MG PO TBDP: 10.0000 mg | ORAL_TABLET | ORAL | 11 refills | Status: DC | PRN

## 2021-05-07 NOTE — Patient Instructions (Signed)
Try maxalt for acute headache Switch to Lamictal XR 100 mg daily, can you stop the Lamictal 100 mg AM Continue Keppra  Check labs  Call for any seizures See you back 1 year

## 2021-05-09 LAB — CBC WITH DIFFERENTIAL/PLATELET
Basophils Absolute: 0 10*3/uL (ref 0.0–0.2)
Basos: 0 %
EOS (ABSOLUTE): 0 10*3/uL (ref 0.0–0.4)
Eos: 0 %
Hematocrit: 41.2 % (ref 34.0–46.6)
Hemoglobin: 13 g/dL (ref 11.1–15.9)
Immature Grans (Abs): 0 10*3/uL (ref 0.0–0.1)
Immature Granulocytes: 0 %
Lymphocytes Absolute: 1.3 10*3/uL (ref 0.7–3.1)
Lymphs: 18 %
MCH: 26.7 pg (ref 26.6–33.0)
MCHC: 31.6 g/dL (ref 31.5–35.7)
MCV: 85 fL (ref 79–97)
Monocytes Absolute: 0.5 10*3/uL (ref 0.1–0.9)
Monocytes: 6 %
Neutrophils Absolute: 5.5 10*3/uL (ref 1.4–7.0)
Neutrophils: 76 %
Platelets: 370 10*3/uL (ref 150–450)
RBC: 4.86 x10E6/uL (ref 3.77–5.28)
RDW: 13.3 % (ref 11.7–15.4)
WBC: 7.3 10*3/uL (ref 3.4–10.8)

## 2021-05-09 LAB — COMPREHENSIVE METABOLIC PANEL
ALT: 11 IU/L (ref 0–32)
AST: 14 IU/L (ref 0–40)
Albumin/Globulin Ratio: 1.5 (ref 1.2–2.2)
Albumin: 4.4 g/dL (ref 3.8–4.8)
Alkaline Phosphatase: 102 IU/L (ref 44–121)
BUN/Creatinine Ratio: 12 (ref 9–23)
BUN: 10 mg/dL (ref 6–24)
Bilirubin Total: 0.2 mg/dL (ref 0.0–1.2)
CO2: 22 mmol/L (ref 20–29)
Calcium: 9 mg/dL (ref 8.7–10.2)
Chloride: 103 mmol/L (ref 96–106)
Creatinine, Ser: 0.84 mg/dL (ref 0.57–1.00)
Globulin, Total: 2.9 g/dL (ref 1.5–4.5)
Glucose: 91 mg/dL (ref 70–99)
Potassium: 3.8 mmol/L (ref 3.5–5.2)
Sodium: 140 mmol/L (ref 134–144)
Total Protein: 7.3 g/dL (ref 6.0–8.5)
eGFR: 86 mL/min/{1.73_m2} (ref >59)

## 2021-05-09 LAB — LAMOTRIGINE LEVEL: Lamotrigine Lvl: 4.4 ug/mL (ref 2.0–20.0)

## 2021-05-09 LAB — LEVETIRACETAM LEVEL: Levetiracetam Lvl: 53.8 ug/mL — ABNORMAL HIGH (ref 10.0–40.0)

## 2021-05-12 ENCOUNTER — Telehealth: Payer: Self-pay | Admitting: *Deleted

## 2021-05-12 NOTE — Telephone Encounter (Signed)
Spoke with patient and informed of lab results. Patient verbalized understanding and expressed appreciation for the call. All questions answered.

## 2021-05-12 NOTE — Telephone Encounter (Signed)
-----   Message from Suzzanne Cloud, NP sent at 05/12/2021  2:21 PM EST ----- Please call the patient, labs show random Keppra level is mildly elevated at 53.8, this is ok since no side effect plus not a trough level, CBC and CMP are normal, Lamictal level is therapeutic at 4.4. Continue with medication plan to switch to Lamictal XR, continue Keppra XR. Let me know if she has any questions!! Thanks!!

## 2021-06-13 ENCOUNTER — Other Ambulatory Visit: Payer: Self-pay | Admitting: Neurology

## 2021-11-28 ENCOUNTER — Telehealth: Payer: Self-pay | Admitting: Neurology

## 2021-11-28 NOTE — Telephone Encounter (Signed)
Pt having headaches for week and half. Taking rizatriptan (MAXALT-MLT) 10 MG disintegrating tablet but when taken increases blood pressure. Scheduled appt 12/02/21 at 8:45 am

## 2021-12-02 ENCOUNTER — Ambulatory Visit (INDEPENDENT_AMBULATORY_CARE_PROVIDER_SITE_OTHER): Payer: Commercial Managed Care - HMO | Admitting: Neurology

## 2021-12-02 ENCOUNTER — Other Ambulatory Visit: Payer: Self-pay | Admitting: Neurology

## 2021-12-02 ENCOUNTER — Encounter: Payer: Self-pay | Admitting: Neurology

## 2021-12-02 VITALS — BP 136/87 | HR 73 | Ht 67.0 in | Wt 318.0 lb

## 2021-12-02 DIAGNOSIS — G43111 Migraine with aura, intractable, with status migrainosus: Secondary | ICD-10-CM | POA: Diagnosis not present

## 2021-12-02 DIAGNOSIS — G40209 Localization-related (focal) (partial) symptomatic epilepsy and epileptic syndromes with complex partial seizures, not intractable, without status epilepticus: Secondary | ICD-10-CM | POA: Diagnosis not present

## 2021-12-02 MED ORDER — LEVETIRACETAM ER 750 MG PO TB24: 3000.0000 mg | ORAL_TABLET | Freq: Every day | ORAL | 4 refills | Status: DC

## 2021-12-02 MED ORDER — LAMOTRIGINE ER 100 MG PO TB24: 100.0000 mg | ORAL_TABLET | Freq: Every day | ORAL | 11 refills | Status: DC

## 2021-12-02 MED ORDER — NURTEC 75 MG PO TBDP: 75.0000 mg | ORAL_TABLET | ORAL | 11 refills | Status: DC | PRN

## 2021-12-02 NOTE — Progress Notes (Signed)
PATIENT: Ann Travis DOB: Nov 03, 1975  REASON FOR VISIT: Follow up for seizures, headaches HISTORY FROM: Patient PRIMARY NEUROLOGIST: Dr. Terrace Travis   HISTORY  Ann Travis is a 46 year old female, seen in request by primary care nurse practitioner Ann Travis for evaluation of seizure and migraine headaches, initial evaluation was on November 21, 2018,   I have reviewed and summarized the referring note from the referring physician.  She was diagnosed with complex partial seizure since 2008, presented with transient confusion, mumbling, finger fidgety movement, few minutes, she was under the care of [facility] neurologist, per patient, there was abnormality seen on MRI of the brain, and EEG, over the years she was treated with titrating dose of Keppra, lamotrigine, has been on current stable dose of Keppra XR 750 mg 4 tablets every morning, lamotrigine 150 mg twice a day, which works well for her seizure, last week current seizure was few years ago,   She also reported long history of migraine headaches, usually at the right retro-orbital area, preceded by visual aura, followed by pounding headache with associated light noise sensitivity, lasting few hours to few days, over-the-counter Excedrin Migraine does not provide effective help   Update February 20, 2020: Laboratory evaluation showed normal CBC CMP TSH, She did not have previously ordered MRI of the brain and EEG due to concern of the cost, she is taking Keppra XR 750 mg 4 tablets every night, lamotrigine only 150 mg every morning, there was no recurrent seizure, She has headache once every 2 weeks, she never picked up her Imitrex,   Previously treating neurologist was at [facility], last office visit with them was early 2020 before she moved  Addendum: I read [facility] neurology evaluation by Dr.Noroozi, Ann Travis From March 25, 2018, reported history of seizure, on generic Keppra XR 750 mg 4 tablets daily, generic lamotrigine 150 mg twice a day,  normal MRI of the brain and EEG in 2006.  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 On Keppra 750 mg XR, 4 tablets in the morning. No seizures, are characterized as lapse in time or mumbling, none in years. Migraines are twice a month that are severe, Imitrex 50 mg is out of the medication, had to combine with Excedrin migraine, benefit wasn't great. Right sided headache. Had sleep study, no sleep apnea.  Update December 02, 2021 SS: Labs at last visit 05/07/21 showed Lamictal level 4.4, Keppra level 53.8, CBC and CMP were normal. Wants to discuss new rescue medications, took Maxalt, felt increased BP, had sinus issues at the time, was having to take frequently, felt the Maxalt caused her sinuses to drain. Went to GYN last week BP was in 170's, had taken Maxalt earlier that day. Migraines are right sided. Feeling better today after taking Sudafed. Was also taking Excedrin Migraine.   REVIEW OF SYSTEMS: Out of a complete 14 system review of symptoms, the patient complains only of the following symptoms, and all other reviewed systems are negative.  See HPI  ALLERGIES: Allergies  Allergen Reactions   Morphine And Related Anaphylaxis and Hives    HOME MEDICATIONS: Outpatient Medications Prior to Visit  Medication Sig Dispense Refill   aspirin-acetaminophen-caffeine (EXCEDRIN MIGRAINE) 250-250-65 MG tablet  Take by mouth every 6 (six) hours as needed for headache.     CALCIUM PO Take 600 mg by mouth daily.     docusate sodium (COLACE) 100 MG capsule Take 1 capsule (100 mg total) by mouth 2 (two) times daily. 60 capsule 11   medroxyPROGESTERone (DEPO-PROVERA) 150 MG/ML injection Inject 150 mg into the muscle  every 3 (three) months.     rizatriptan (MAXALT-MLT) 10 MG disintegrating tablet Take 1 tablet (10 mg total) by mouth as needed for migraine. May repeat in 2 hours if needed 10 tablet 11   lamoTRIgine (LAMICTAL XR) 100 MG 24 hour tablet Take 1 tablet (100 mg total) by mouth daily. 30 tablet 11   Levetiracetam 750 MG TB24 Take 4 tablets (3,000 mg total) by mouth daily. 360 tablet 4   No facility-administered medications prior to visit.    PAST MEDICAL HISTORY: Past Medical History:  Diagnosis Date   Migraine    Seizure (HCC)     PAST SURGICAL HISTORY: Past Surgical History:  Procedure Laterality Date   CHOLECYSTECTOMY     TONSILLECTOMY      FAMILY HISTORY: Family History  Problem Relation Age of Onset   Hypertension Mother    Stroke Mother    Breast cancer Mother        passed away at age 73   Hypercholesterolemia Mother    Diabetes Father    Hypertension Father    Hypercholesterolemia Father    Breast cancer Maternal Aunt        passed away at age 26   Diabetes Maternal Aunt     SOCIAL HISTORY: Social History   Socioeconomic History   Marital status: Single    Spouse name: Not on file   Number of children: 1   Years of education: college   Highest education level: Not on file  Occupational History   Occupation: Child psychotherapist  Tobacco Use   Smoking status: Never   Smokeless tobacco: Never  Substance and Sexual Activity   Alcohol use: Not Currently   Drug use: Never   Sexual activity: Not on file  Other Topics Concern   Not on file  Social History Narrative   Lives alone.   No caffeine use.   Right-handed.   Social Determinants of Health   Financial Resource Strain: Not on file  Food Insecurity: Not on file  Transportation Needs: Not on file  Physical Activity: Not on file  Stress: Not on file  Social Connections: Not on file  Intimate Partner Violence: Not on file   PHYSICAL EXAM  Vitals:   12/02/21 0838  BP: 136/87  Pulse: 73  Weight: (!)  318 lb (144.2 kg)  Height: 5\' 7"  (1.702 m)   Body mass index is 49.81 kg/m.  Generalized: Well developed, in no acute distress  Neurological examination  Mentation: Alert oriented to time, place, history taking. Follows all commands speech and language fluent Cranial nerve II-XII: Pupils were equal round reactive to light. Extraocular movements were full, visual field were full on confrontational test. Facial sensation and strength were normal. Head turning and shoulder shrug  were normal and symmetric. Motor: The motor testing reveals 5 over 5 strength of all 4 extremities. Good symmetric motor tone is noted throughout.  Sensory: Sensory testing is intact to soft touch on all 4 extremities. No evidence of extinction is noted.  Coordination: Cerebellar testing reveals good finger-nose-finger and heel-to-shin bilaterally.  Gait and station: Gait is normal.  Reflexes: Deep tendon reflexes  are symmetric and normal bilaterally.   DIAGNOSTIC DATA (LABS, IMAGING, TESTING) - I reviewed patient records, labs, notes, testing and imaging myself where available.  Lab Results  Component Value Date   WBC 7.3 05/07/2021   HGB 13.0 05/07/2021   HCT 41.2 05/07/2021   MCV 85 05/07/2021   PLT 370 05/07/2021      Component Value Date/Time   NA 140 05/07/2021 1347   K 3.8 05/07/2021 1347   CL 103 05/07/2021 1347   CO2 22 05/07/2021 1347   GLUCOSE 91 05/07/2021 1347   BUN 10 05/07/2021 1347   CREATININE 0.84 05/07/2021 1347   CALCIUM 9.0 05/07/2021 1347   PROT 7.3 05/07/2021 1347   ALBUMIN 4.4 05/07/2021 1347   AST 14 05/07/2021 1347   ALT 11 05/07/2021 1347   ALKPHOS 102 05/07/2021 1347   BILITOT 0.2 05/07/2021 1347   GFRNONAA 68 11/21/2018 0809   GFRAA 78 11/21/2018 0809   No results found for: "CHOL", "HDL", "LDLCALC", "LDLDIRECT", "TRIG", "CHOLHDL" No results found for: "HGBA1C" No results found for: "VITAMINB12" Lab Results  Component Value Date   TSH 1.850 11/21/2018    ASSESSMENT AND PLAN 45 y.o. year old female   1.  History of complex partial seizure  -No recent seizures -Continue Keppra, Lamictal at current doses  2.  Chronic migraine headache without aura -Try Nurtec 75 mg as needed for acute headache, given # 2 boxes of samples today; can use co-pay card  -Recent cluster headache likely triggered by sinus issues, rebound headache with medication overuse; reviewed options for virtual visit for sinus issues -Tried and failed: Imitrex, Maxalt, Excedrin Migraine -Encouraged to reach out via MyChart if needed -Follow-up with me in 6 months or sooner if needed  Meds ordered this encounter  Medications   Rimegepant Sulfate (NURTEC) 75 MG TBDP    Sig: Take 75 mg by mouth as needed (take 1 tablet at onset, max is 1 tablet in 24 hours).    Dispense:  8 tablet    Refill:  11   levETIRAcetam (KEPPRA XR) 750 MG 24 hr tablet    Sig: Take 4 tablets (3,000 mg total) by mouth daily.    Dispense:  360 tablet    Refill:  4   lamoTRIgine (LAMICTAL XR) 100 MG 24 hour tablet    Sig: Take 1 tablet (100 mg total) by mouth daily.    Dispense:  30 tablet    Refill:  [address] [place], [place] 12/02/2021, 9:07 AM [facility] [address], [address] [place], [place] [address] [phone]

## 2021-12-02 NOTE — Patient Instructions (Signed)
Try Nurtec for acute headache, max is 1 tablet in 24 hours, use co pay card Consider doing virtual visit for sinus issues  Careful with headache overmedication, potentially rebound headache going on See you back in 6 months

## 2021-12-02 NOTE — Telephone Encounter (Signed)
Noted; will discuss at visit today.

## 2021-12-04 MED ORDER — NURTEC 75 MG PO TBDP: ORAL_TABLET | ORAL | 11 refills | Status: DC

## 2021-12-04 NOTE — Addendum Note (Signed)
Addended by: Ann Maki on: 12/04/2021 04:44 PM   Modules accepted: Orders

## 2022-01-03 IMAGING — DX DG UGI W SINGLE CM
10 of 11 series · 14 of 17 positions shown · non-contrast
Comparison: No pertinent prior exams are available for comparison.

CLINICAL DATA: Morbid obesity. Additional history provided: Patient
reports preoperative examination for bariatric surgeon.

EXAM:
UPPER GI SERIES WITH KUB
TECHNIQUE: After obtaining a scout radiograph a routine upper GI series was
performed using thin and high density barium.
FLUOROSCOPY TIME:  Fluoroscopy Time:  2 minutes, 24 seconds.
Radiation Exposure Index (if provided by the fluoroscopic device):
67.3 mGy
Number of Acquired Spot Images: 4

[t abdomen supine]
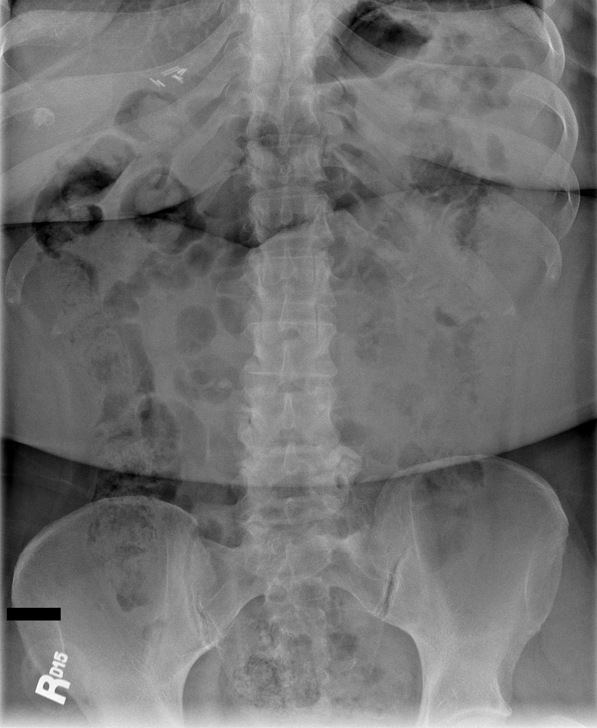

[Series 2: cp_standard · 0.51mm/px · 3 of 129 frames shown (1 of 5)]
[frame 9/129]
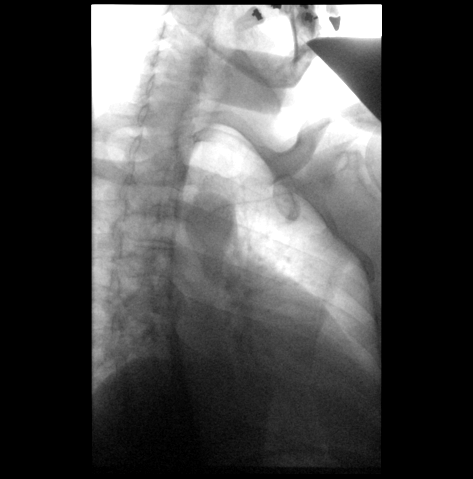
[frame 65/129]
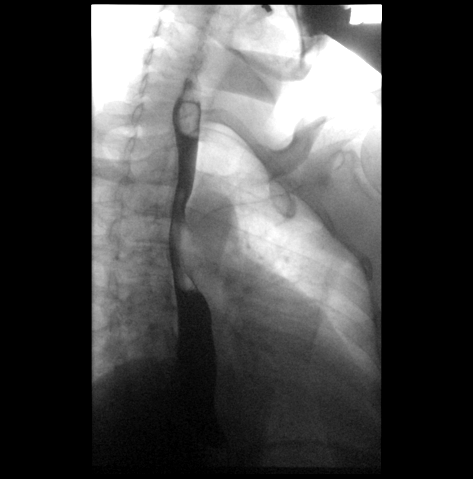
[frame 110/129]
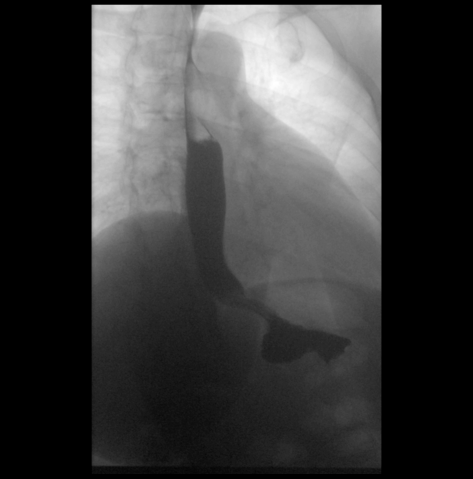

[cp_standard (2 of 5)]
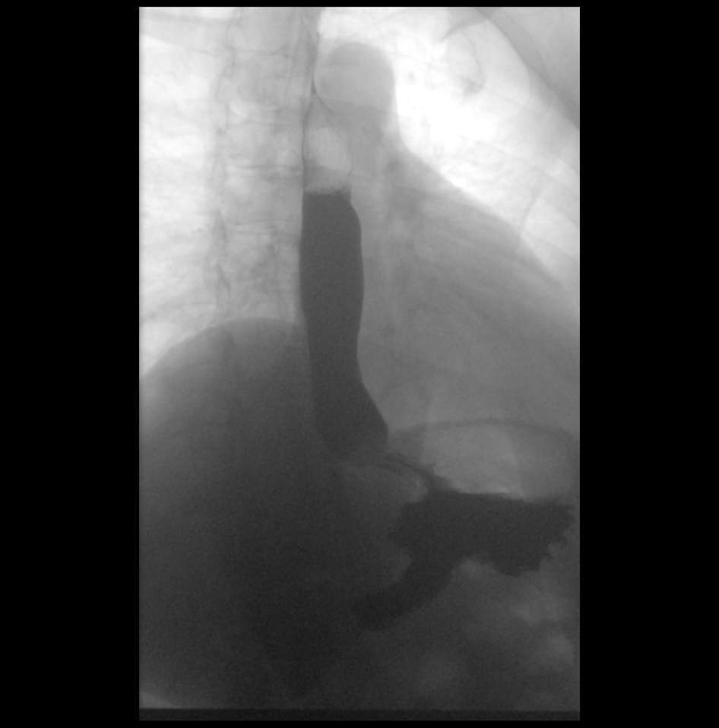

[fluoro_barium singleshot_bw (1 of 4)]
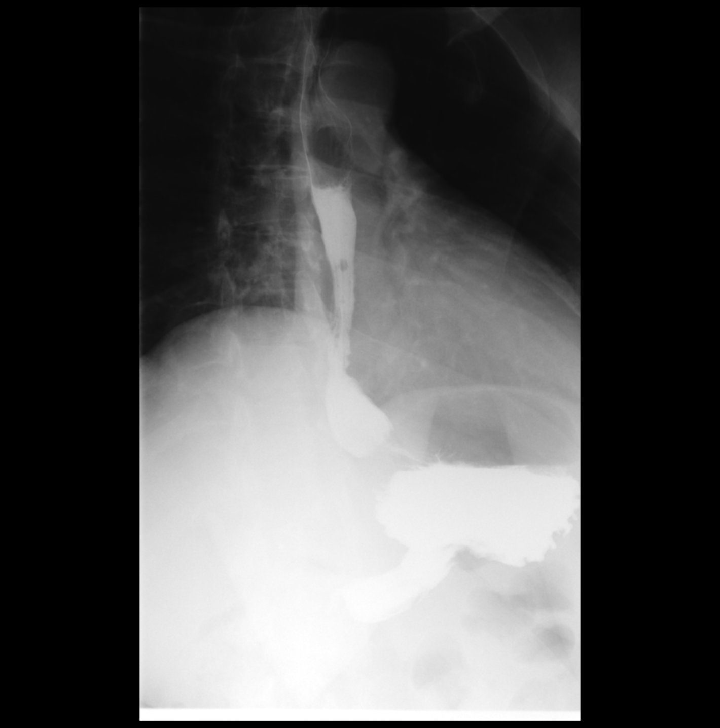

[Series 5: cp_standard · 0.51mm/px · 3 of 72 frames shown (3 of 5)]
[frame 2/72]
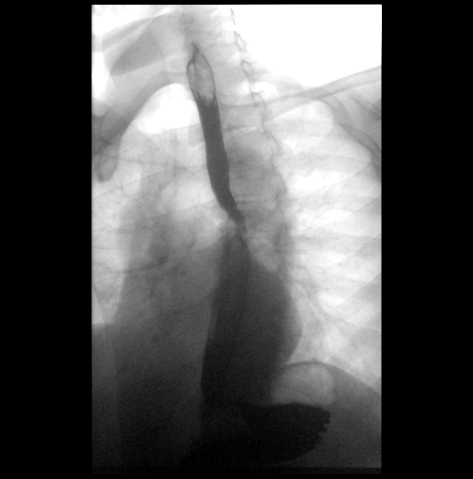
[frame 37/72]
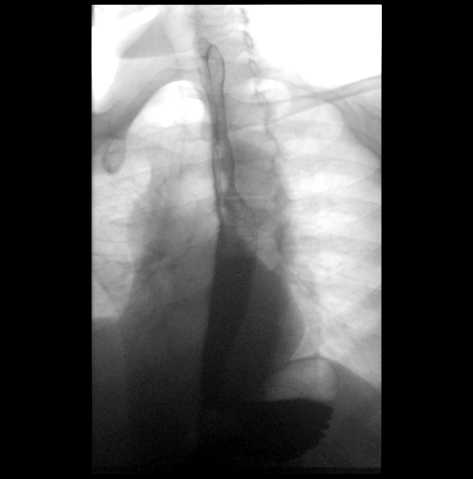
[frame 62/72]
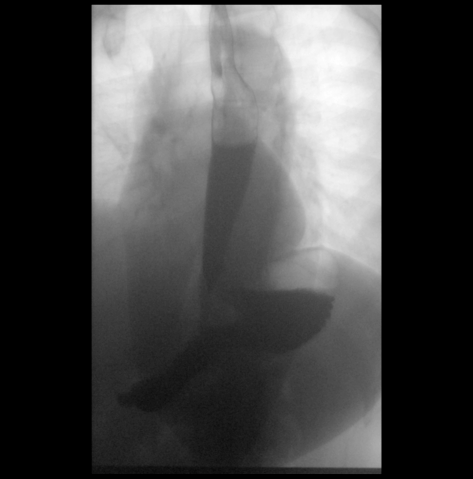

[fluoro_barium singleshot_bw (2 of 4)]
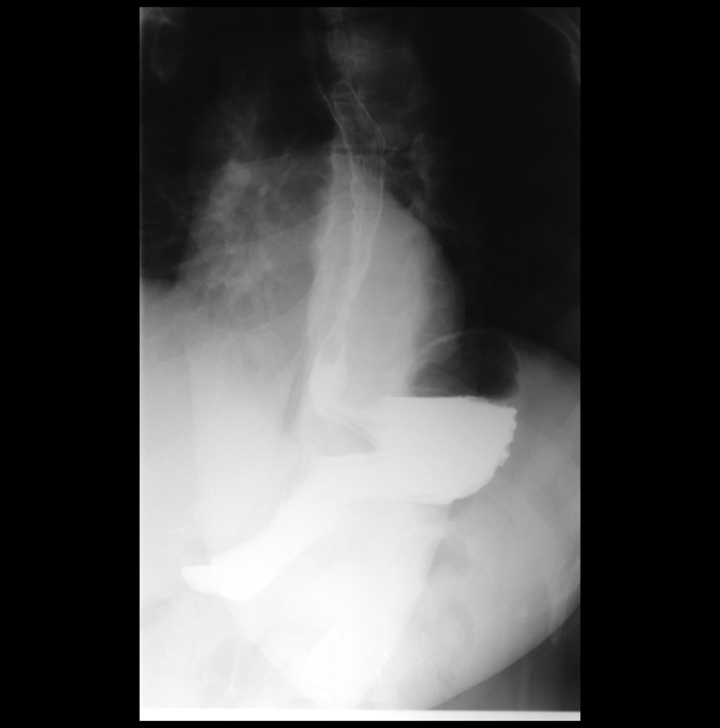

[fluoro_barium singleshot_bw (3 of 4)]
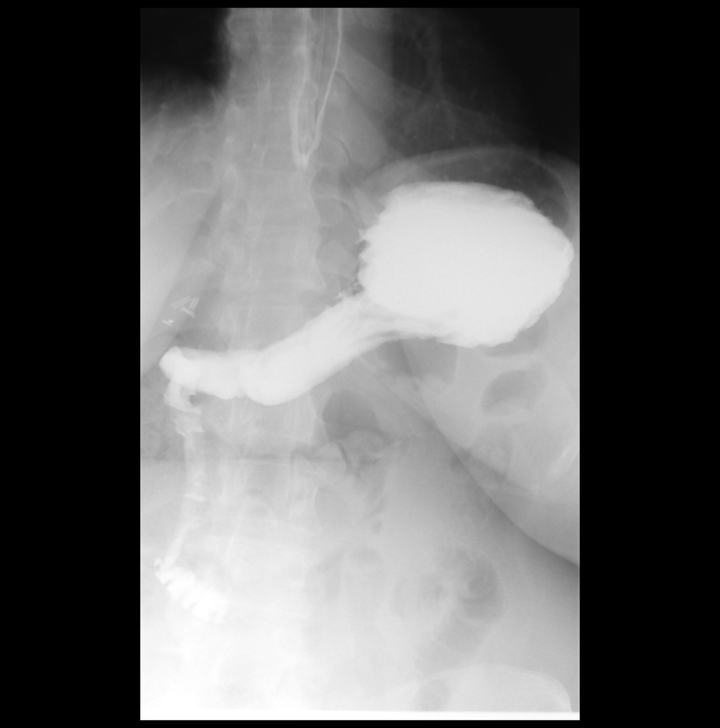

[fluoro_barium singleshot_bw (4 of 4)]
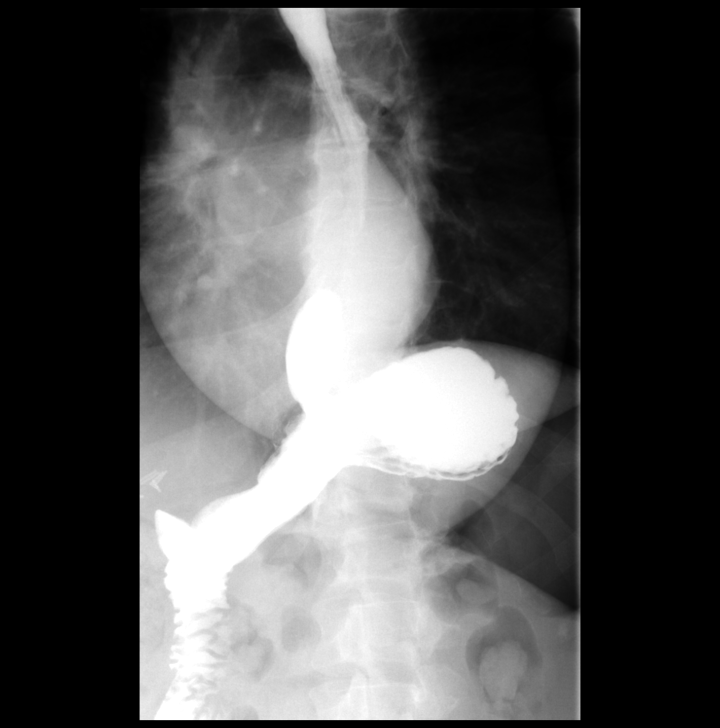

[cp_standard (4 of 5)]
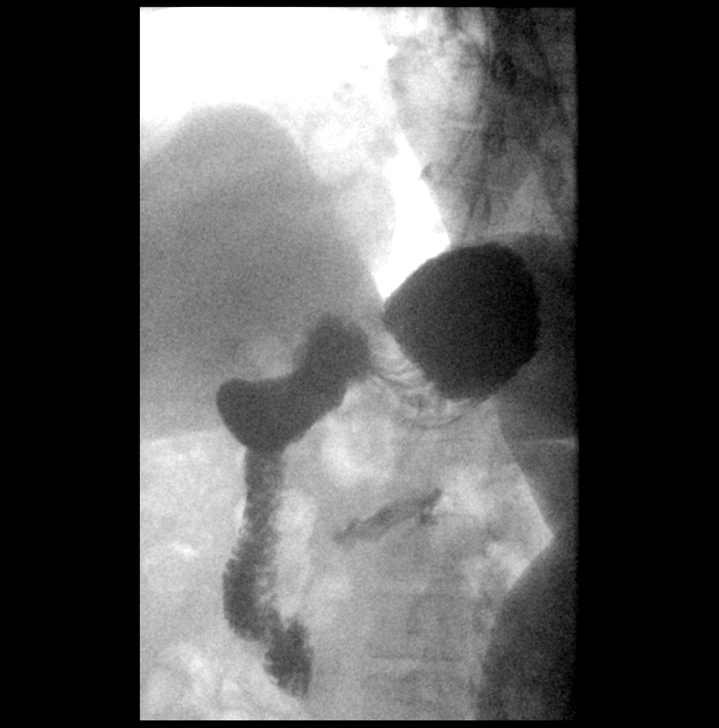

[cp_standard (5 of 5)]
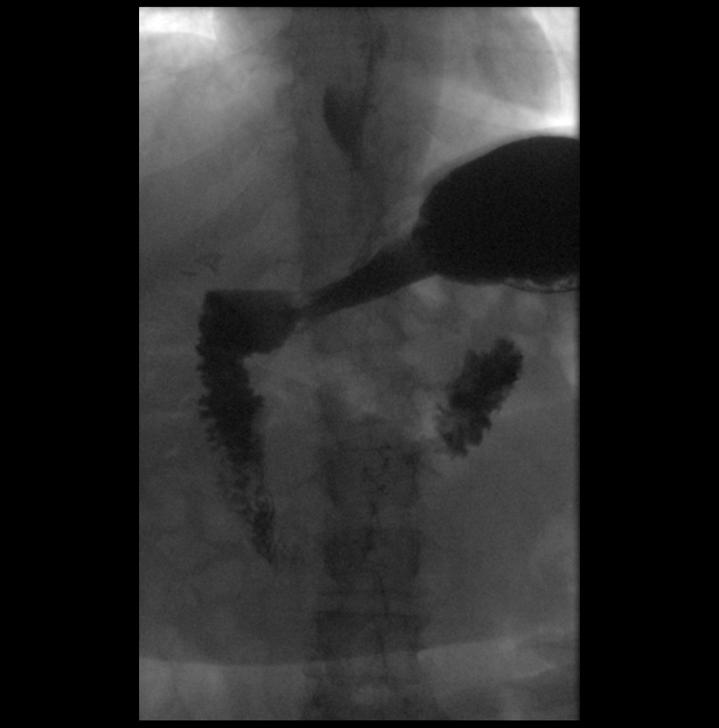

[14 of 17 positions shown; findings below may reference images not displayed]

FINDINGS: A scout radiograph of the abdomen demonstrates cholecystectomy clips
within the right upper quadrant. Nonobstructive bowel gas pattern.
Degenerative changes of the lower lumbar spine

Fluoroscopic evaluation demonstrates normal caliber and smooth
contour of the esophagus. No evidence of fixed stricture, mass or
mucosal abnormality. Normal esophageal motility was observed. No
hiatal hernia. No reflux was observed. The patient swallowed a 13 mm
barium tablet, which freely passed into the stomach.

Normal appearance of the stomach, duodenal bulb and duodenal sweep.
IMPRESSION: Unremarkable single-contrast preoperative upper GI series, as
described.

## 2022-04-28 DIAGNOSIS — Z3042 Encounter for surveillance of injectable contraceptive: Secondary | ICD-10-CM | POA: Diagnosis not present

## 2022-05-07 ENCOUNTER — Ambulatory Visit: Payer: Managed Care, Other (non HMO) | Admitting: Neurology

## 2022-06-08 DIAGNOSIS — H2512 Age-related nuclear cataract, left eye: Secondary | ICD-10-CM | POA: Diagnosis not present

## 2022-06-08 DIAGNOSIS — H4423 Degenerative myopia, bilateral: Secondary | ICD-10-CM | POA: Diagnosis not present

## 2022-06-08 DIAGNOSIS — H2511 Age-related nuclear cataract, right eye: Secondary | ICD-10-CM | POA: Diagnosis not present

## 2022-06-09 DIAGNOSIS — Z13220 Encounter for screening for lipoid disorders: Secondary | ICD-10-CM | POA: Diagnosis not present

## 2022-06-09 DIAGNOSIS — Z131 Encounter for screening for diabetes mellitus: Secondary | ICD-10-CM | POA: Diagnosis not present

## 2022-06-09 DIAGNOSIS — Z6841 Body Mass Index (BMI) 40.0 and over, adult: Secondary | ICD-10-CM | POA: Diagnosis not present

## 2022-07-14 DIAGNOSIS — Z3042 Encounter for surveillance of injectable contraceptive: Secondary | ICD-10-CM | POA: Diagnosis not present

## 2022-10-07 DIAGNOSIS — Z3042 Encounter for surveillance of injectable contraceptive: Secondary | ICD-10-CM | POA: Diagnosis not present

## 2022-11-03 DIAGNOSIS — M170 Bilateral primary osteoarthritis of knee: Secondary | ICD-10-CM | POA: Diagnosis not present

## 2022-11-03 DIAGNOSIS — G8929 Other chronic pain: Secondary | ICD-10-CM | POA: Diagnosis not present

## 2022-11-03 DIAGNOSIS — M25561 Pain in right knee: Secondary | ICD-10-CM | POA: Diagnosis not present

## 2022-11-03 DIAGNOSIS — M25562 Pain in left knee: Secondary | ICD-10-CM | POA: Diagnosis not present

## 2022-11-09 DIAGNOSIS — R87612 Low grade squamous intraepithelial lesion on cytologic smear of cervix (LGSIL): Secondary | ICD-10-CM | POA: Diagnosis not present

## 2022-11-09 DIAGNOSIS — Z1329 Encounter for screening for other suspected endocrine disorder: Secondary | ICD-10-CM | POA: Diagnosis not present

## 2022-11-09 DIAGNOSIS — Z114 Encounter for screening for human immunodeficiency virus [HIV]: Secondary | ICD-10-CM | POA: Diagnosis not present

## 2022-11-09 DIAGNOSIS — Z118 Encounter for screening for other infectious and parasitic diseases: Secondary | ICD-10-CM | POA: Diagnosis not present

## 2022-11-09 DIAGNOSIS — Z0000 Encounter for general adult medical examination without abnormal findings: Secondary | ICD-10-CM | POA: Diagnosis not present

## 2022-11-09 DIAGNOSIS — Z113 Encounter for screening for infections with a predominantly sexual mode of transmission: Secondary | ICD-10-CM | POA: Diagnosis not present

## 2022-11-09 DIAGNOSIS — Z13220 Encounter for screening for lipoid disorders: Secondary | ICD-10-CM | POA: Diagnosis not present

## 2022-11-09 DIAGNOSIS — Z131 Encounter for screening for diabetes mellitus: Secondary | ICD-10-CM | POA: Diagnosis not present

## 2022-11-09 DIAGNOSIS — Z1231 Encounter for screening mammogram for malignant neoplasm of breast: Secondary | ICD-10-CM | POA: Diagnosis not present

## 2022-11-09 DIAGNOSIS — Z01419 Encounter for gynecological examination (general) (routine) without abnormal findings: Secondary | ICD-10-CM | POA: Diagnosis not present

## 2022-11-09 DIAGNOSIS — R87810 Cervical high risk human papillomavirus (HPV) DNA test positive: Secondary | ICD-10-CM | POA: Diagnosis not present

## 2022-11-09 DIAGNOSIS — Z1159 Encounter for screening for other viral diseases: Secondary | ICD-10-CM | POA: Diagnosis not present

## 2022-11-18 ENCOUNTER — Other Ambulatory Visit: Payer: Self-pay | Admitting: Neurology

## 2022-11-18 ENCOUNTER — Ambulatory Visit: Payer: 59 | Admitting: Neurology

## 2022-11-18 ENCOUNTER — Encounter: Payer: Self-pay | Admitting: Neurology

## 2022-11-18 VITALS — BP 147/95 | HR 78 | Ht 67.0 in | Wt 318.6 lb

## 2022-11-18 DIAGNOSIS — G43111 Migraine with aura, intractable, with status migrainosus: Secondary | ICD-10-CM | POA: Diagnosis not present

## 2022-11-18 DIAGNOSIS — G40209 Localization-related (focal) (partial) symptomatic epilepsy and epileptic syndromes with complex partial seizures, not intractable, without status epilepticus: Secondary | ICD-10-CM

## 2022-11-18 MED ORDER — LEVETIRACETAM ER 750 MG PO TB24: 3000.0000 mg | ORAL_TABLET | Freq: Every day | ORAL | 4 refills | Status: DC

## 2022-11-18 MED ORDER — NURTEC 75 MG PO TBDP
75.0000 mg | ORAL_TABLET | ORAL | 11 refills | Status: AC | PRN
Start: 2022-11-18 — End: ?

## 2022-11-18 MED ORDER — LAMOTRIGINE ER 100 MG PO TB24: 100.0000 mg | ORAL_TABLET | Freq: Every day | ORAL | 4 refills | Status: DC

## 2022-11-18 NOTE — Patient Instructions (Signed)
Great to see you today.  Continue current doses of Keppra and Lamictal.  Call for any seizures.  I ordered Nurtec as needed for migraine treatment.  I will see back in 1 year.  Thanks!!

## 2022-11-18 NOTE — Progress Notes (Signed)
PATIENT: Shaw Alice DOB: 1975/12/23  REASON FOR VISIT: Follow up for seizures, headaches HISTORY FROM: Patient PRIMARY NEUROLOGIST: Dr. Terrace Arabia   HISTORY  Latonyia Muldowney is a 47 year old female, seen in request by primary care nurse practitioner Arlana Lindau for evaluation of seizure and migraine headaches, initial evaluation was on November 21, 2018,   I have reviewed and summarized the referring note from the referring physician.  She was diagnosed with complex partial seizure since 2008, presented with transient confusion, mumbling, finger fidgety movement, few minutes, she was under the care of [facility] neurologist, per patient, there was abnormality seen on MRI of the brain, and EEG, over the years she was treated with titrating dose of Keppra, lamotrigine, has been on current stable dose of Keppra XR 750 mg 4 tablets every morning, lamotrigine 150 mg twice a day, which works well for her seizure, last week current seizure was few years ago,   She also reported long history of migraine headaches, usually at the right retro-orbital area, preceded by visual aura, followed by pounding headache with associated light noise sensitivity, lasting few hours to few days, over-the-counter Excedrin Migraine does not provide effective help   Update February 20, 2020: Laboratory evaluation showed normal CBC CMP TSH, She did not have previously ordered MRI of the brain and EEG due to concern of the cost, she is taking Keppra XR 750 mg 4 tablets every night, lamotrigine only 150 mg every morning, there was no recurrent seizure, She has headache once every 2 weeks, she never picked up her Imitrex,   Previously treating neurologist was at [facility], last office visit with them was early 2020 before she moved  Addendum: I read [facility] neurology evaluation by Dr.Noroozi, Jilda Panda From March 25, 2018, reported history of seizure, on generic Keppra XR 750 mg 4 tablets daily, generic lamotrigine 150 mg twice a day,  normal MRI of the brain and EEG in 2006.  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 On Keppra 750 mg XR, 4 tablets in the morning. No seizures, are characterized as lapse in time or mumbling, none in years. Migraines are twice a month that are severe, Imitrex 50 mg is out of the medication, had to combine with Excedrin migraine, benefit wasn't great. Right sided headache. Had sleep study, no sleep apnea.  Update December 02, 2021 SS: Labs at last visit 05/07/21 showed Lamictal level 4.4, Keppra level 53.8, CBC and CMP were normal. Wants to discuss new rescue medications, took Maxalt, felt increased BP, had sinus issues at the time, was having to take frequently, felt the Maxalt caused her sinuses to drain. Went to GYN last week BP was in 170's, had taken Maxalt earlier that day. Migraines are right sided. Feeling better today after taking Sudafed. Was also taking Excedrin Migraine.   Update November 18, 2022 SS: Few more migraines with weather change, within last year 8 migraines. Last month lasted 3 days. Maxalt works, but sometimes have to repeat. Nurtec wasn't covered. Tried sample of Nurtec, it worked great, try again. No seizures to report, remains on Keppra XR 750 mg,  4 tablets daily, Lamictal XR 100 mg daily. Had cataract surgery both eyes in April. Going to  see GI for possible ulcer? Last seizure was probably 8-9 years ago. Describes as staring, humming, rocking, fidgeting hands, lapse in time.    REVIEW OF SYSTEMS: Out of a complete 14 system review of symptoms, the patient complains only of the following symptoms, and all other reviewed systems are negative.  See HPI  ALLERGIES: Allergies  Allergen  Reactions   Morphine And Codeine Anaphylaxis and Hives   Fruit Extracts     Apples, oranges and other fruits ( throat swells and  Oral itching)   Apples, oranges and other fruits ( throat swells and  Oral itching)   Vegetable Extract     Lettuce, cabbage, carrots ( oral edema and itching)   Lettuce, cabbage, carrots ( oral edema and itching)    HOME MEDICATIONS: Outpatient Medications Prior to Visit  Medication Sig Dispense Refill   acetaminophen (TYLENOL) 650 MG CR tablet Take 650 mg by mouth every 8 (eight) hours as needed for pain.     aspirin-acetaminophen-caffeine (EXCEDRIN MIGRAINE) 250-250-65 MG tablet Take by mouth every 6 (six) hours as needed for headache.     CALCIUM PO Take 600 mg by mouth daily.     Coenzyme Q10 100 MG capsule Take 200 mg by mouth daily.     docusate sodium (COLACE) 100 MG capsule Take 1 capsule (100 mg total) by mouth 2 (two) times daily. 60 capsule 11   folic acid (FOLVITE) 1 MG tablet Take 1 mg by mouth daily.     medroxyPROGESTERone (DEPO-PROVERA) 150 MG/ML injection Inject 150 mg into the muscle every 3 (three) months.     naproxen sodium (ALEVE) 220 MG tablet Take 220 mg by mouth daily as needed.     Rimegepant Sulfate (NURTEC) 75 MG TBDP TAKE 1 TABLET BY MOUTH AS NEEDED AT ONSET OF HEADACHE *MAX 1 TAB IN 24 HOURS* 8 tablet 11   rizatriptan (MAXALT-MLT) 10 MG disintegrating tablet Take 1 tablet (10 mg total) by mouth as needed for migraine. May repeat in 2 hours if needed 10 tablet 11   lamoTRIgine (LAMICTAL XR) 100 MG 24 hour tablet Take 1 tablet (100 mg total) by mouth daily. 30 tablet 11   levETIRAcetam (KEPPRA XR) 750 MG 24 hr tablet Take 4 tablets (3,000 mg total) by mouth daily. 360 tablet 4   No facility-administered medications prior to visit.    PAST MEDICAL HISTORY: Past Medical History:  Diagnosis Date   Migraine    Seizure (HCC)     PAST SURGICAL HISTORY: Past Surgical History:  Procedure Laterality Date   CHOLECYSTECTOMY      TONSILLECTOMY      FAMILY HISTORY: Family History  Problem Relation Age of Onset   Hypertension Mother    Stroke Mother    Breast cancer Mother        passed away at age 40   Hypercholesterolemia Mother    Diabetes Father    Hypertension Father    Hypercholesterolemia Father    Breast cancer Maternal Aunt        passed away at age 19   Diabetes Maternal Aunt     SOCIAL HISTORY: Social History   Socioeconomic History   Marital status: Single    Spouse name: Not on file   Number of children: 1   Years of education: college   Highest education level: Not on file  Occupational History   Occupation: Child psychotherapist  Tobacco Use   Smoking status: Never   Smokeless tobacco: Never  Substance and Sexual  Activity   Alcohol use: Not Currently   Drug use: Never   Sexual activity: Not on file  Other Topics Concern   Not on file  Social History Narrative   Lives alone.   No caffeine use.   Right-handed.   Social Determinants of Health   Financial Resource Strain: Not on file  Food Insecurity: Not on file  Transportation Needs: Not on file  Physical Activity: Not on file  Stress: Not on file  Social Connections: Not on file  Intimate Partner Violence: Not on file   PHYSICAL EXAM  Vitals:   11/18/22 0834  BP: (!) 147/95  Pulse: 78  Weight: (!) 318 lb 9.6 oz (144.5 kg)  Height: 5\' 7"  (1.702 m)   Body mass index is 49.9 kg/m.  Generalized: Well developed, in no acute distress  Neurological examination  Mentation: Alert oriented to time, place, history taking. Follows all commands speech and language fluent Cranial nerve II-XII: Pupils were equal round reactive to light. Extraocular movements were full, visual field were full on confrontational test. Facial sensation and strength were normal. Head turning and shoulder shrug  were normal and symmetric. Motor: The motor testing reveals 5 over 5 strength of all 4 extremities. Good symmetric motor tone is noted  throughout.  Sensory: Sensory testing is intact to soft touch on all 4 extremities. No evidence of extinction is noted.  Coordination: Cerebellar testing reveals good finger-nose-finger and heel-to-shin bilaterally.  Gait and station: Gait is normal.   DIAGNOSTIC DATA (LABS, IMAGING, TESTING) - I reviewed patient records, labs, notes, testing and imaging myself where available.  Lab Results  Component Value Date   WBC 7.3 05/07/2021   HGB 13.0 05/07/2021   HCT 41.2 05/07/2021   MCV 85 05/07/2021   PLT 370 05/07/2021      Component Value Date/Time   NA 140 05/07/2021 1347   K 3.8 05/07/2021 1347   CL 103 05/07/2021 1347   CO2 22 05/07/2021 1347   GLUCOSE 91 05/07/2021 1347   BUN 10 05/07/2021 1347   CREATININE 0.84 05/07/2021 1347   CALCIUM 9.0 05/07/2021 1347   PROT 7.3 05/07/2021 1347   ALBUMIN 4.4 05/07/2021 1347   AST 14 05/07/2021 1347   ALT 11 05/07/2021 1347   ALKPHOS 102 05/07/2021 1347   BILITOT 0.2 05/07/2021 1347   GFRNONAA 68 11/21/2018 0809   GFRAA 78 11/21/2018 0809   No results found for: "CHOL", "HDL", "LDLCALC", "LDLDIRECT", "TRIG", "CHOLHDL" No results found for: "HGBA1C" No results found for: "VITAMINB12" Lab Results  Component Value Date   TSH 1.850 11/21/2018   ASSESSMENT AND PLAN 47 y.o. year old female   1.  History of complex partial seizure  -No recent seizures -Continue Keppra, Lamictal -Reviewed CMP from GYN  2.  Chronic migraine headache without aura -Nurtec 75 mg as needed for acute headache -Tried and failed: Imitrex, Maxalt, Excedrin Migraine -Follow-up 1 year or sooner if needed  Meds ordered this encounter  Medications   Rimegepant Sulfate (NURTEC) 75 MG TBDP    Sig: Take 1 tablet (75 mg total) by mouth as needed.    Dispense:  8 tablet    Refill:  11   levETIRAcetam (KEPPRA XR) 750 MG 24 hr tablet    Sig: Take 4 tablets (3,000 mg total) by mouth daily.    Dispense:  360 tablet    Refill:  4   lamoTRIgine (LAMICTAL XR)  100 MG 24 hour tablet    Sig: Take 1 tablet (  100 mg total) by mouth daily.    Dispense:  90 tablet    Refill:  [address] [place], [place] 11/18/2022, 9:18 AM [facility] [address] [place], [place] [address] [phone]

## 2022-11-19 ENCOUNTER — Telehealth: Payer: Self-pay

## 2022-11-19 ENCOUNTER — Other Ambulatory Visit (HOSPITAL_COMMUNITY): Payer: Self-pay

## 2022-11-19 NOTE — Telephone Encounter (Signed)
Pharmacy Patient Advocate Encounter   Received notification from RX Request Messages that prior authorization for Nurtec 75MG  dispersible tablets is required/requested.   Insurance verification completed.   The patient is insured through [facility] .   Per test claim: PA required; PA submitted to [facility] via CoverMyMeds Key/confirmation #/EOC [facility] Status is pending

## 2022-11-20 ENCOUNTER — Other Ambulatory Visit (HOSPITAL_COMMUNITY): Payer: Self-pay

## 2022-11-20 NOTE — Telephone Encounter (Signed)
Pharmacy Patient Advocate Encounter  Received notification from [facility] that Prior Authorization for Nurtec 75MG  dispersible tablets has been DENIED. Please advise how you'd like to proceed. Full denial letter will be uploaded to the media tab. See denial reason below.    MUST TRY UBRELVY FIRST  PA #/Case ID/Reference #: PA Case ID #: [id number]

## 2022-11-23 MED ORDER — UBRELVY 100 MG PO TABS: 100.0000 mg | ORAL_TABLET | ORAL | 11 refills | Status: DC | PRN

## 2022-11-23 NOTE — Telephone Encounter (Signed)
Pt returned call and I spoke w/them in regards to trying [place] 1st and they voiced gratitude and understanding

## 2022-11-23 NOTE — Telephone Encounter (Signed)
Okay to try Ubrelvy 1st. Will you please let the patient know insurance is requiring her try. Thanks  Meds ordered this encounter  Medications   Ubrogepant (UBRELVY) 100 MG TABS    Sig: Take 1 tablet (100 mg total) by mouth as needed. Take 1 tablet at onset of headache, may repeat in 2 hours if needed. Max is 200 mg in 24 hours.    Dispense:  12 tablet    Refill:  11

## 2022-11-23 NOTE — Telephone Encounter (Signed)
1st attempt left msg to call back .

## 2022-12-02 DIAGNOSIS — R87810 Cervical high risk human papillomavirus (HPV) DNA test positive: Secondary | ICD-10-CM | POA: Diagnosis not present

## 2022-12-02 DIAGNOSIS — Z3200 Encounter for pregnancy test, result unknown: Secondary | ICD-10-CM | POA: Diagnosis not present

## 2022-12-02 DIAGNOSIS — R87612 Low grade squamous intraepithelial lesion on cytologic smear of cervix (LGSIL): Secondary | ICD-10-CM | POA: Diagnosis not present

## 2022-12-07 ENCOUNTER — Other Ambulatory Visit (HOSPITAL_COMMUNITY): Payer: Self-pay

## 2022-12-07 ENCOUNTER — Telehealth: Payer: Self-pay

## 2022-12-07 NOTE — Telephone Encounter (Signed)
*  [facility]  Pharmacy Patient Advocate Encounter   Received notification from CoverMyMeds that prior authorization for Ubrelvy 100MG  tablets  is required/requested.   Insurance verification completed.   The patient is insured through [facility] .   Per test claim: PA required; PA submitted to [facility] via CoverMyMeds Key/confirmation #/EOC [id number] Status is pending

## 2022-12-08 ENCOUNTER — Other Ambulatory Visit (HOSPITAL_COMMUNITY): Payer: Self-pay

## 2022-12-08 NOTE — Telephone Encounter (Signed)
Pharmacy Patient Advocate Encounter  Received notification from [facility] that Prior Authorization for Ubrelvy 100MG  tablets has been APPROVED from 12/07/2022 to 12/06/2023. Ran test claim, Copay is $0 per 30DS/12 Tablets. This test claim was processed through [facility]- copay amounts may vary at other pharmacies due to pharmacy/plan contracts, or as the patient moves through the different stages of their insurance plan.   PA #/Case ID/Reference #: PA Case ID #: [id number]

## 2022-12-23 DIAGNOSIS — Z3042 Encounter for surveillance of injectable contraceptive: Secondary | ICD-10-CM | POA: Diagnosis not present

## 2023-02-03 NOTE — Progress Notes (Deleted)
02/03/2023 Ann Travis [id number] 03-27-1976   CHIEF COMPLAINT:   HISTORY OF PRESENT ILLNESS: Ann Travis is a 47 year old female with a past medical history of migraine headaches and complex partial seizures initially diagnosed in 2008 on Keppra and Lamictal. She presents to our office today as referred by Dr. Griffith Citron to schedule a screening colonoscopy.      Latest Ref Rng & Units 05/07/2021    1:47 PM 11/21/2018    8:09 AM  CBC  WBC 3.4 - 10.8 x10E3/uL 7.3  6.2   Hemoglobin 11.1 - 15.9 g/dL 33.2  95.1   Hematocrit 34.0 - 46.6 % 41.2  40.2   Platelets 150 - 450 x10E3/uL 370         Latest Ref Rng & Units 05/07/2021    1:47 PM 11/21/2018    8:09 AM  CMP  Glucose 70 - 99 mg/dL 91  70   BUN 6 - 24 mg/dL 10  14   Creatinine 8.84 - 1.00 mg/dL 1.66  0.63   Sodium 016 - 144 mmol/L 140  138   Potassium 3.5 - 5.2 mmol/L 3.8  4.0   Chloride 96 - 106 mmol/L 103  101   CO2 20 - 29 mmol/L 22  24   Calcium 8.7 - 10.2 mg/dL 9.0  9.2   Total Protein 6.0 - 8.5 g/dL 7.3  7.0   Total Bilirubin 0.0 - 1.2 mg/dL 0.2  0.2   Alkaline Phos 44 - 121 IU/L 102  96   AST 0 - 40 IU/L 14  19   ALT 0 - 32 IU/L 11  16      Past Medical History:  Diagnosis Date   Migraine    Seizure (HCC)    Past Surgical History:  Procedure Laterality Date   CHOLECYSTECTOMY     TONSILLECTOMY     Social History:  Family History:    reports that she has never smoked. She has never used smokeless tobacco. She reports that she does not currently use alcohol. She reports that she does not use drugs. family history includes Breast cancer in her maternal aunt and mother; Diabetes in her father and maternal aunt; Hypercholesterolemia in her father and mother; Hypertension in her father and mother; Stroke in her mother.  Allergies  Allergen Reactions   Morphine And Codeine Anaphylaxis and Hives   Fruit Extracts     Apples, oranges and other fruits ( throat swells and  Oral itching)   Apples,  oranges and other fruits ( throat swells and  Oral itching)   Vegetable Extract     Lettuce, cabbage, carrots ( oral edema and itching)   Lettuce, cabbage, carrots ( oral edema and itching)      Outpatient Encounter Medications as of 02/04/2023  Medication Sig   acetaminophen (TYLENOL) 650 MG CR tablet Take 650 mg by mouth every 8 (eight) hours as needed for pain.   aspirin-acetaminophen-caffeine (EXCEDRIN MIGRAINE) 250-250-65 MG tablet Take by mouth every 6 (six) hours as needed for headache.   CALCIUM PO Take 600 mg by mouth daily.   Coenzyme Q10 100 MG capsule Take 200 mg by mouth daily.   docusate sodium (COLACE) 100 MG capsule Take 1 capsule (100 mg total) by mouth 2 (two) times daily.   folic acid (FOLVITE) 1 MG tablet Take 1 mg by mouth daily.   lamoTRIgine (LAMICTAL XR) 100 MG 24 hour tablet Take 1 tablet (100 mg total) by mouth daily.  levETIRAcetam (KEPPRA XR) 750 MG 24 hr tablet Take 4 tablets (3,000 mg total) by mouth daily.   medroxyPROGESTERone (DEPO-PROVERA) 150 MG/ML injection Inject 150 mg into the muscle every 3 (three) months.   naproxen sodium (ALEVE) 220 MG tablet Take 220 mg by mouth daily as needed.   NURTEC 75 MG TBDP TAKE 1 TABLET (75 MG TOTAL) BY MOUTH AS NEEDED.   Rimegepant Sulfate (NURTEC) 75 MG TBDP TAKE 1 TABLET BY MOUTH AS NEEDED AT ONSET OF HEADACHE *MAX 1 TAB IN 24 HOURS*   rizatriptan (MAXALT-MLT) 10 MG disintegrating tablet Take 1 tablet (10 mg total) by mouth as needed for migraine. May repeat in 2 hours if needed   Ubrogepant (UBRELVY) 100 MG TABS Take 1 tablet (100 mg total) by mouth as needed. Take 1 tablet at onset of headache, may repeat in 2 hours if needed. Max is 200 mg in 24 hours.   No facility-administered encounter medications on file as of 02/04/2023.     REVIEW OF SYSTEMS:  Gen: Denies fever, sweats or chills. No weight loss.  CV: Denies chest pain, palpitations or edema. Resp: Denies cough, shortness of breath of hemoptysis.  GI:  Denies heartburn, dysphagia, stomach or lower abdominal pain. No diarrhea or constipation.  GU: Denies urinary burning, blood in urine, increased urinary frequency or incontinence. MS: Denies joint pain, muscles aches or weakness. Derm: Denies rash, itchiness, skin lesions or unhealing ulcers. Psych: Denies depression, anxiety, memory loss or confusion. Heme: Denies bruising, easy bleeding. Neuro:  Denies headaches, dizziness or paresthesias. Endo:  Denies any problems with DM, thyroid or adrenal function.  PHYSICAL EXAM: There were no vitals taken for this visit. General: in no acute distress. Head: Normocephalic and atraumatic. Eyes:  Sclerae non-icteric, conjunctive pink. Ears: Normal auditory acuity. Mouth: Dentition intact. No ulcers or lesions.  Neck: Supple, no lymphadenopathy or thyromegaly.  Lungs: Clear bilaterally to auscultation without wheezes, crackles or rhonchi. Heart: Regular rate and rhythm. No murmur, rub or gallop appreciated.  Abdomen: Soft, nontender, nondistended. No masses. No hepatosplenomegaly. Normoactive bowel sounds x 4 quadrants.  Rectal: Deferred.  Musculoskeletal: Symmetrical with no gross deformities. Skin: Warm and dry. No rash or lesions on visible extremities. Extremities: No edema. Neurological: Alert oriented x 4, no focal deficits.  Psychological:  Alert and cooperative. Normal mood and affect.  ASSESSMENT AND PLAN:    CC:  Irena Reichmann, DO

## 2023-02-04 ENCOUNTER — Ambulatory Visit: Payer: Self-pay | Admitting: Nurse Practitioner

## 2023-03-10 DIAGNOSIS — Z3042 Encounter for surveillance of injectable contraceptive: Secondary | ICD-10-CM | POA: Diagnosis not present

## 2023-05-10 ENCOUNTER — Telehealth: Payer: Self-pay | Admitting: Pharmacist

## 2023-05-10 NOTE — Telephone Encounter (Signed)
 Pharmacy Patient Advocate Encounter   Received notification from CoverMyMeds that prior authorization for Ubrelvy  100MG  tablets is required/requested.   Insurance verification completed.   The patient is insured through [facility] .   Per test claim: PA required; PA submitted to above mentioned insurance via CoverMyMeds Key/confirmation #/EOC [facility] Status is pending

## 2023-05-17 ENCOUNTER — Other Ambulatory Visit (HOSPITAL_COMMUNITY): Payer: Self-pay

## 2023-05-17 NOTE — Telephone Encounter (Signed)
 Pharmacy Patient Advocate Encounter  Received notification from [facility] that Prior Authorization for Bernita Raisin has been APPROVED from 05/10/2023 to 08/02/2023

## 2023-05-25 ENCOUNTER — Other Ambulatory Visit: Payer: Self-pay | Admitting: Medical Genetics

## 2023-06-04 ENCOUNTER — Other Ambulatory Visit: Payer: Self-pay

## 2023-06-04 DIAGNOSIS — Z006 Encounter for examination for normal comparison and control in clinical research program: Secondary | ICD-10-CM

## 2023-06-16 LAB — GENECONNECT MOLECULAR SCREEN: Genetic Analysis Overall Interpretation: NEGATIVE

## 2023-07-21 ENCOUNTER — Other Ambulatory Visit (HOSPITAL_COMMUNITY): Payer: Self-pay

## 2023-07-23 ENCOUNTER — Other Ambulatory Visit (HOSPITAL_COMMUNITY): Payer: Self-pay

## 2023-07-23 ENCOUNTER — Telehealth: Payer: Self-pay

## 2023-07-23 NOTE — Telephone Encounter (Signed)
 IT is time to renew PA for Ubrelvy , PT has not been evaluated since starting the medication. Insurance requires documentation stating if th emedication has helped the patients migraine severity and frequency. Below are the questions insurance is asking. Please advise-Thanks.

## 2023-07-26 ENCOUNTER — Other Ambulatory Visit (HOSPITAL_COMMUNITY): Payer: Self-pay

## 2023-07-26 NOTE — Telephone Encounter (Signed)
 Pharmacy Patient Advocate Encounter  Received notification from [facility] that Prior Authorization for Ubrelvy  100MG  tablets has been APPROVED from 07/26/2023 to 07/25/2024   PA #/Case ID/Reference #: PA Case ID #: [id number]

## 2023-07-26 NOTE — Telephone Encounter (Signed)
 Pharmacy Patient Advocate Encounter   Received notification from CoverMyMeds that prior authorization for Ubrelvy  100MG  tablets is required/requested.   Insurance verification completed.   The patient is insured through [facility] .   Per test claim: PA required; PA submitted to above mentioned insurance via CoverMyMeds Key/confirmation #/EOC [facility] Status is pending

## 2023-11-24 ENCOUNTER — Ambulatory Visit: Payer: 59 | Admitting: Neurology

## 2023-11-24 ENCOUNTER — Encounter: Payer: Self-pay | Admitting: Neurology

## 2023-11-24 VITALS — BP 124/85 | HR 74 | Ht 67.0 in | Wt 270.0 lb

## 2023-11-24 DIAGNOSIS — G43111 Migraine with aura, intractable, with status migrainosus: Secondary | ICD-10-CM | POA: Diagnosis not present

## 2023-11-24 DIAGNOSIS — G40209 Localization-related (focal) (partial) symptomatic epilepsy and epileptic syndromes with complex partial seizures, not intractable, without status epilepticus: Secondary | ICD-10-CM

## 2023-11-24 MED ORDER — UBRELVY 100 MG PO TABS: 100.0000 mg | ORAL_TABLET | ORAL | 11 refills | Status: DC | PRN

## 2023-11-24 MED ORDER — LAMOTRIGINE ER 100 MG PO TB24: 100.0000 mg | ORAL_TABLET | Freq: Every day | ORAL | 4 refills | Status: AC

## 2023-11-24 MED ORDER — LEVETIRACETAM ER 750 MG PO TB24: 3000.0000 mg | ORAL_TABLET | Freq: Every day | ORAL | 4 refills | Status: AC

## 2023-11-24 NOTE — Progress Notes (Signed)
 PATIENT: Codie Hainer DOB: 01-Apr-1975  REASON FOR VISIT: Follow up for seizures, headaches HISTORY FROM: Patient PRIMARY NEUROLOGIST: Dr. Onita   HISTORY  Bleu Moisan is a 48 year old female, seen in request by primary care nurse practitioner Gasper Clarity for evaluation of seizure and migraine headaches, initial evaluation was on November 21, 2018,   I have reviewed and summarized the referring note from the referring physician.  She was diagnosed with complex partial seizure since 2008, presented with transient confusion, mumbling, finger fidgety movement, few minutes, she was under the care of [facility] neurologist, per patient, there was abnormality seen on MRI of the brain, and EEG, over the years she was treated with titrating dose of Keppra , lamotrigine , has been on current stable dose of Keppra  XR 750 mg 4 tablets every morning, lamotrigine  150 mg twice a day, which works well for her seizure, last week current seizure was few years ago,   She also reported long history of migraine headaches, usually at the right retro-orbital area, preceded by visual aura, followed by pounding headache with associated light noise sensitivity, lasting few hours to few days, over-the-counter Excedrin Migraine does not provide effective help   Update February 20, 2020: Laboratory evaluation showed normal CBC CMP TSH, She did not have previously ordered MRI of the brain and EEG due to concern of the cost, she is taking Keppra  XR 750 mg 4 tablets every night, lamotrigine  only 150 mg every morning, there was no recurrent seizure, She has headache once every 2 weeks, she never picked up her Imitrex ,   Previously treating neurologist was at [facility], last office visit with them was early 2020 before she moved  Addendum: I read [facility] neurology evaluation by Dr.Noroozi, Emeline From March 25, 2018, reported history of seizure, on generic Keppra  XR 750 mg 4 tablets daily, generic lamotrigine  150 mg twice a day,  normal MRI of the brain and EEG in 2006.  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 On Keppra  750 mg XR, 4 tablets in the morning. No seizures, are characterized as lapse in time or mumbling, none in years. Migraines are twice a month that are severe, Imitrex  50 mg is out of the medication, had to combine with Excedrin migraine, benefit wasn't great. Right sided headache. Had sleep study, no sleep apnea.  Update December 02, 2021 SS: Labs at last visit 05/07/21 showed Lamictal  level 4.4, Keppra  level 53.8, CBC and CMP were normal. Wants to discuss new rescue medications, took Maxalt , felt increased BP, had sinus issues at the time, was having to take frequently, felt the Maxalt  caused her sinuses to drain. Went to GYN last week BP was in 170's, had taken Maxalt  earlier that day. Migraines are right sided. Feeling better today after taking Sudafed. Was also taking Excedrin Migraine.   Update November 18, 2022 SS: Few more migraines with weather change, within last year 8 migraines. Last month lasted 3 days. Maxalt  works, but sometimes have to repeat. Nurtec wasn't covered. Tried sample of Nurtec, it worked great, try again. No seizures to report, remains on Keppra  XR 750 mg,  4 tablets daily, Lamictal  XR 100 mg daily. Had cataract surgery both eyes in April. Going to  see GI for possible ulcer? Last seizure was probably 8-9 years ago. Describes as staring, humming, rocking, fidgeting hands, lapse in time.    Update November 24, 2023 SS: Insurance denied Nurtec, using Ubrelvy . With the heat and summer, takes Ubrelvy  about once a week, rarely has to take 2 tablets. No seizures. Last was about 10 years ago. Is on Depo shot, no  longer taking folic acid. Has lost 50 lbs in the last year, needs knee replacement.   REVIEW OF SYSTEMS: Out of a complete 14 system review of symptoms, the patient complains only of the following symptoms, and all other reviewed systems are negative.  See HPI  ALLERGIES: Allergies  Allergen Reactions   Morphine And Codeine Anaphylaxis and Hives   Fruit Extracts     Apples, oranges and other fruits ( throat swells and  Oral itching)   Apples, oranges and other fruits ( throat swells and  Oral itching)   Vegetable Extract     Lettuce, cabbage, carrots ( oral edema and itching)   Lettuce, cabbage, carrots ( oral edema and itching)    HOME MEDICATIONS: Outpatient Medications Prior to Visit  Medication Sig Dispense Refill   acetaminophen (TYLENOL) 650 MG CR tablet Take 650 mg by mouth every 8 (eight) hours as needed for pain.     Coenzyme Q10 100 MG capsule Take 200 mg by mouth daily.     medroxyPROGESTERone (DEPO-PROVERA) 150 MG/ML injection Inject 150 mg into the muscle every 3 (three) months.     Turmeric 500 MG CAPS Take by mouth.     lamoTRIgine  (LAMICTAL  XR) 100 MG 24 hour tablet Take 1 tablet (100 mg total) by mouth daily. 90 tablet 4   levETIRAcetam  (KEPPRA  XR) 750 MG 24 hr tablet Take 4 tablets (3,000 mg total) by mouth daily. 360 tablet 4   Ubrogepant  (UBRELVY ) 100 MG TABS Take 1 tablet (100 mg total) by mouth as needed. Take 1 tablet at onset of headache, may repeat in 2 hours if needed. Max is 200 mg in 24 hours. 12 tablet 11   aspirin-acetaminophen-caffeine (EXCEDRIN MIGRAINE) 250-250-65 MG tablet Take by mouth every 6 (six) hours as needed for headache. (Patient not taking: Reported on 11/24/2023)     CALCIUM PO Take 600 mg by mouth daily. (Patient not taking: Reported on 11/24/2023)     docusate sodium  (COLACE) 100 MG capsule Take 1 capsule (100 mg total) by mouth 2 (two) times daily. (Patient not taking: Reported on 11/24/2023) 60 capsule 11   folic acid (FOLVITE) 1 MG  tablet Take 1 mg by mouth daily. (Patient not taking: Reported on 11/24/2023)     naproxen sodium (ALEVE) 220 MG tablet Take 220 mg by mouth daily as needed. (Patient not taking: Reported on 11/24/2023)     NURTEC 75 MG TBDP TAKE 1 TABLET (75 MG TOTAL) BY MOUTH AS NEEDED. (Patient not taking: Reported on 11/24/2023) 8 tablet 11   Rimegepant Sulfate (NURTEC) 75 MG TBDP TAKE 1 TABLET BY MOUTH AS NEEDED AT ONSET OF HEADACHE *MAX 1 TAB IN 24 HOURS* (Patient not taking: Reported on 11/24/2023) 8 tablet 11   rizatriptan  (MAXALT -MLT) 10 MG disintegrating tablet Take 1 tablet (10 mg total) by mouth as needed for migraine. May repeat in 2 hours if needed (Patient not taking: Reported on 11/24/2023) 10 tablet 11   No facility-administered medications prior to visit.    PAST MEDICAL HISTORY: Past Medical History:  Diagnosis Date   Migraine    Seizure (HCC)  PAST SURGICAL HISTORY: Past Surgical History:  Procedure Laterality Date   CHOLECYSTECTOMY     TONSILLECTOMY      FAMILY HISTORY: Family History  Problem Relation Age of Onset   Hypertension Mother    Stroke Mother    Breast cancer Mother        passed away at age 85   Hypercholesterolemia Mother    Diabetes Father    Hypertension Father    Hypercholesterolemia Father    Breast cancer Maternal Aunt        passed away at age 68   Diabetes Maternal Aunt     SOCIAL HISTORY: Social History   Socioeconomic History   Marital status: Single    Spouse name: Not on file   Number of children: 1   Years of education: college   Highest education level: Not on file  Occupational History   Occupation: Child psychotherapist  Tobacco Use   Smoking status: Never   Smokeless tobacco: Never  Substance and Sexual Activity   Alcohol use: Not Currently   Drug use: Never   Sexual activity: Not on file  Other Topics Concern   Not on file  Social History Narrative   Lives alone.   No caffeine use.   Right-handed.   Social Drivers of Manufacturing engineer Strain: Not on file  Food Insecurity: Low Risk  (05/03/2023)   Received from [facility]   Hunger Vital Sign    Within the past 12 months, you worried that your food would run out before you got money to buy more: Never true    Within the past 12 months, the food you bought just didn't last and you didn't have money to get more. : Never true  Transportation Needs: No Transportation Needs (05/03/2023)   Received from [facility]    In the past 12 months, has lack of reliable transportation kept you from medical appointments, meetings, work or from getting things needed for daily living? : No  Physical Activity: Not on file  Stress: Not on file  Social Connections: Not on file  Intimate Partner Violence: Not on file   PHYSICAL EXAM  Vitals:   11/24/23 0836  BP: 124/85  Pulse: 74  SpO2: 96%  Weight: 270 lb (122.5 kg)  Height: 5' 7 (1.702 m)    Body mass index is 42.29 kg/m.  Generalized: Well developed, in no acute distress  Neurological examination  Mentation: Alert oriented to time, place, history taking. Follows all commands speech and language fluent Cranial nerve II-XII: Pupils were equal round reactive to light. Extraocular movements were full, visual field were full on confrontational test. Facial sensation and strength were normal. Head turning and shoulder shrug  were normal and symmetric. Motor: The motor testing reveals 5 over 5 strength of all 4 extremities. Good symmetric motor tone is noted throughout.  Sensory: Sensory testing is intact to soft touch on all 4 extremities. No evidence of extinction is noted.  Coordination: Cerebellar testing reveals good finger-nose-finger and heel-to-shin bilaterally.  Gait and station: Gait is normal.   DIAGNOSTIC DATA (LABS, IMAGING, TESTING) - I reviewed patient records, labs, notes, testing and imaging myself where available.  Lab Results  Component Value Date   WBC 7.3 05/07/2021    HGB 13.0 05/07/2021   HCT 41.2 05/07/2021   MCV 85 05/07/2021   PLT 370 05/07/2021      Component Value Date/Time   NA 140 05/07/2021 1347  K 3.8 05/07/2021 1347   CL 103 05/07/2021 1347   CO2 22 05/07/2021 1347   GLUCOSE 91 05/07/2021 1347   BUN 10 05/07/2021 1347   CREATININE 0.84 05/07/2021 1347   CALCIUM 9.0 05/07/2021 1347   PROT 7.3 05/07/2021 1347   ALBUMIN 4.4 05/07/2021 1347   AST 14 05/07/2021 1347   ALT 11 05/07/2021 1347   ALKPHOS 102 05/07/2021 1347   BILITOT 0.2 05/07/2021 1347   GFRNONAA 68 11/21/2018 0809   GFRAA 78 11/21/2018 0809   No results found for: CHOL, HDL, LDLCALC, LDLDIRECT, TRIG, CHOLHDL No results found for: YHAJ8R No results found for: VITAMINB12 Lab Results  Component Value Date   TSH 1.850 11/21/2018   ASSESSMENT AND PLAN 48 y.o. year old female   1.  History of complex partial seizure  -No recent seizures in the last 10 years  -Continue Keppra , Lamictal  -Check routine labs today   2.  Chronic migraine headache without aura -Continue Ubrelvy  100 mg as needed for acute migraine treatment -Tried and failed: Imitrex , Maxalt , Excedrin Migraine, insurance denied Nurtec -Follow-up 1 year or sooner if needed  Meds ordered this encounter  Medications   Ubrogepant  (UBRELVY ) 100 MG TABS    Sig: Take 1 tablet (100 mg total) by mouth as needed. Take 1 tablet at onset of headache, may repeat in 2 hours if needed. Max is 200 mg in 24 hours.    Dispense:  16 tablet    Refill:  11   lamoTRIgine  (LAMICTAL  XR) 100 MG 24 hour tablet    Sig: Take 1 tablet (100 mg total) by mouth daily.    Dispense:  90 tablet    Refill:  4   levETIRAcetam  (KEPPRA  XR) 750 MG 24 hr tablet    Sig: Take 4 tablets (3,000 mg total) by mouth daily.    Dispense:  360 tablet    Refill:  4   Lauraine Born, [place], [place] 11/24/2023, 9:09 AM [facility] [address] [place], [place] [address] [phone]

## 2023-11-24 NOTE — Patient Instructions (Signed)
 Great to see you today! Continue current medications Check labs Call for seizures or increase in migraine Follow-up in 1 year.  Thanks!!

## 2023-11-25 LAB — COMPREHENSIVE METABOLIC PANEL WITH GFR
ALT: 12 IU/L (ref 0–32)
AST: 16 IU/L (ref 0–40)
Albumin: 4.3 g/dL (ref 3.9–4.9)
Alkaline Phosphatase: 80 IU/L (ref 44–121)
BUN/Creatinine Ratio: 14 (ref 9–23)
BUN: 12 mg/dL (ref 6–24)
Bilirubin Total: 0.3 mg/dL (ref 0.0–1.2)
CO2: 21 mmol/L (ref 20–29)
Calcium: 8.8 mg/dL (ref 8.7–10.2)
Chloride: 105 mmol/L (ref 96–106)
Creatinine, Ser: 0.85 mg/dL (ref 0.57–1.00)
Globulin, Total: 2.5 g/dL (ref 1.5–4.5)
Glucose: 85 mg/dL (ref 70–99)
Potassium: 4.4 mmol/L (ref 3.5–5.2)
Sodium: 139 mmol/L (ref 134–144)
Total Protein: 6.8 g/dL (ref 6.0–8.5)
eGFR: 84 mL/min/1.73 (ref >59)

## 2023-11-25 LAB — CBC WITH DIFFERENTIAL/PLATELET
Basophils Absolute: 0 x10E3/uL (ref 0.0–0.2)
Basos: 0 %
EOS (ABSOLUTE): 0 x10E3/uL (ref 0.0–0.4)
Eos: 0 %
Hematocrit: 39.5 % (ref 34.0–46.6)
Hemoglobin: 12.3 g/dL (ref 11.1–15.9)
Immature Grans (Abs): 0 x10E3/uL (ref 0.0–0.1)
Immature Granulocytes: 0 %
Lymphocytes Absolute: 1.1 x10E3/uL (ref 0.7–3.1)
Lymphs: 23 %
MCH: 28 pg (ref 26.6–33.0)
MCHC: 31.1 g/dL — ABNORMAL LOW (ref 31.5–35.7)
MCV: 90 fL (ref 79–97)
Monocytes Absolute: 0.3 x10E3/uL (ref 0.1–0.9)
Monocytes: 7 %
Neutrophils Absolute: 3.4 x10E3/uL (ref 1.4–7.0)
Neutrophils: 70 %
Platelets: 328 x10E3/uL (ref 150–450)
RBC: 4.4 x10E6/uL (ref 3.77–5.28)
RDW: 12.5 % (ref 11.7–15.4)
WBC: 4.9 x10E3/uL (ref 3.4–10.8)

## 2023-11-25 LAB — LAMOTRIGINE LEVEL: Lamotrigine Lvl: 2.8 ug/mL (ref 2.0–20.0)

## 2023-11-25 LAB — VITAMIN D 25 HYDROXY (VIT D DEFICIENCY, FRACTURES): Vit D, 25-Hydroxy: 26.6 ng/mL — ABNORMAL LOW (ref 30.0–100.0)

## 2023-11-25 LAB — LEVETIRACETAM LEVEL: Levetiracetam Lvl: 56.7 ug/mL — ABNORMAL HIGH (ref 10.0–40.0)

## 2023-11-29 ENCOUNTER — Ambulatory Visit: Payer: Self-pay | Admitting: Neurology

## 2023-12-03 ENCOUNTER — Other Ambulatory Visit: Payer: Self-pay | Admitting: Neurology

## 2023-12-08 ENCOUNTER — Other Ambulatory Visit (HOSPITAL_COMMUNITY): Payer: Self-pay

## 2023-12-08 ENCOUNTER — Telehealth: Payer: Self-pay | Admitting: Pharmacist

## 2023-12-08 NOTE — Telephone Encounter (Signed)
 Pharmacy Patient Advocate Encounter   Received notification from CoverMyMeds that prior authorization for Ubrelvy  100MG  tablets is required/requested.   Insurance verification completed.   The patient is insured through [facility] [employer] .   Per test claim: PA not needed

## 2024-11-23 ENCOUNTER — Ambulatory Visit: Admitting: Neurology
